# Patient Record
Sex: Male | Born: 1945 | Race: White | Hispanic: No | Marital: Married | Smoking: Never smoker
Health system: Southern US, Community
[De-identification: ages and names within clinical notes are randomized; demographics above are authoritative.]

## PROBLEM LIST (undated history)

## (undated) DIAGNOSIS — E785 Hyperlipidemia, unspecified: Secondary | ICD-10-CM

## (undated) DIAGNOSIS — Z789 Other specified health status: Secondary | ICD-10-CM

## (undated) DIAGNOSIS — M359 Systemic involvement of connective tissue, unspecified: Secondary | ICD-10-CM

## (undated) DIAGNOSIS — R3914 Feeling of incomplete bladder emptying: Secondary | ICD-10-CM

## (undated) DIAGNOSIS — N400 Enlarged prostate without lower urinary tract symptoms: Secondary | ICD-10-CM

## (undated) DIAGNOSIS — Z973 Presence of spectacles and contact lenses: Secondary | ICD-10-CM

## (undated) HISTORY — PX: OTHER SURGICAL HISTORY: SHX169

## (undated) HISTORY — PX: ANKLE FRACTURE SURGERY: SHX122

---

## 2003-09-22 ENCOUNTER — Ambulatory Visit (HOSPITAL_COMMUNITY): Admission: RE | Admit: 2003-09-22 | Discharge: 2003-09-22 | Payer: Self-pay | Admitting: Gastroenterology

## 2016-12-23 HISTORY — PX: COLONOSCOPY: SHX174

## 2017-02-28 DIAGNOSIS — Z85828 Personal history of other malignant neoplasm of skin: Secondary | ICD-10-CM | POA: Diagnosis not present

## 2017-02-28 DIAGNOSIS — Z808 Family history of malignant neoplasm of other organs or systems: Secondary | ICD-10-CM | POA: Diagnosis not present

## 2017-02-28 DIAGNOSIS — D2271 Melanocytic nevi of right lower limb, including hip: Secondary | ICD-10-CM | POA: Diagnosis not present

## 2017-02-28 DIAGNOSIS — L821 Other seborrheic keratosis: Secondary | ICD-10-CM | POA: Diagnosis not present

## 2017-02-28 DIAGNOSIS — M67479 Ganglion, unspecified ankle and foot: Secondary | ICD-10-CM | POA: Diagnosis not present

## 2017-02-28 DIAGNOSIS — L570 Actinic keratosis: Secondary | ICD-10-CM | POA: Diagnosis not present

## 2017-02-28 DIAGNOSIS — Z23 Encounter for immunization: Secondary | ICD-10-CM | POA: Diagnosis not present

## 2017-09-26 DIAGNOSIS — L821 Other seborrheic keratosis: Secondary | ICD-10-CM | POA: Diagnosis not present

## 2017-09-26 DIAGNOSIS — M6740 Ganglion, unspecified site: Secondary | ICD-10-CM | POA: Diagnosis not present

## 2017-09-26 DIAGNOSIS — D0462 Carcinoma in situ of skin of left upper limb, including shoulder: Secondary | ICD-10-CM | POA: Diagnosis not present

## 2017-09-26 DIAGNOSIS — D2271 Melanocytic nevi of right lower limb, including hip: Secondary | ICD-10-CM | POA: Diagnosis not present

## 2017-09-26 DIAGNOSIS — Z85828 Personal history of other malignant neoplasm of skin: Secondary | ICD-10-CM | POA: Diagnosis not present

## 2017-09-26 DIAGNOSIS — L570 Actinic keratosis: Secondary | ICD-10-CM | POA: Diagnosis not present

## 2017-09-26 DIAGNOSIS — Z23 Encounter for immunization: Secondary | ICD-10-CM | POA: Diagnosis not present

## 2017-09-26 DIAGNOSIS — D225 Melanocytic nevi of trunk: Secondary | ICD-10-CM | POA: Diagnosis not present

## 2017-09-26 DIAGNOSIS — D485 Neoplasm of uncertain behavior of skin: Secondary | ICD-10-CM | POA: Diagnosis not present

## 2017-09-26 DIAGNOSIS — Z808 Family history of malignant neoplasm of other organs or systems: Secondary | ICD-10-CM | POA: Diagnosis not present

## 2017-10-31 DIAGNOSIS — L301 Dyshidrosis [pompholyx]: Secondary | ICD-10-CM | POA: Diagnosis not present

## 2017-11-10 DIAGNOSIS — H5213 Myopia, bilateral: Secondary | ICD-10-CM | POA: Diagnosis not present

## 2017-11-21 DIAGNOSIS — L570 Actinic keratosis: Secondary | ICD-10-CM | POA: Diagnosis not present

## 2017-11-21 DIAGNOSIS — L988 Other specified disorders of the skin and subcutaneous tissue: Secondary | ICD-10-CM | POA: Diagnosis not present

## 2017-11-21 DIAGNOSIS — D0462 Carcinoma in situ of skin of left upper limb, including shoulder: Secondary | ICD-10-CM | POA: Diagnosis not present

## 2017-12-02 DIAGNOSIS — N3000 Acute cystitis without hematuria: Secondary | ICD-10-CM | POA: Diagnosis not present

## 2017-12-02 DIAGNOSIS — R350 Frequency of micturition: Secondary | ICD-10-CM | POA: Diagnosis not present

## 2017-12-05 DIAGNOSIS — Z1389 Encounter for screening for other disorder: Secondary | ICD-10-CM | POA: Diagnosis not present

## 2017-12-05 DIAGNOSIS — E7800 Pure hypercholesterolemia, unspecified: Secondary | ICD-10-CM | POA: Diagnosis not present

## 2017-12-05 DIAGNOSIS — Z0000 Encounter for general adult medical examination without abnormal findings: Secondary | ICD-10-CM | POA: Diagnosis not present

## 2017-12-05 DIAGNOSIS — Z79899 Other long term (current) drug therapy: Secondary | ICD-10-CM | POA: Diagnosis not present

## 2017-12-05 DIAGNOSIS — Z85828 Personal history of other malignant neoplasm of skin: Secondary | ICD-10-CM | POA: Diagnosis not present

## 2017-12-05 DIAGNOSIS — N3000 Acute cystitis without hematuria: Secondary | ICD-10-CM | POA: Diagnosis not present

## 2018-01-26 DIAGNOSIS — R69 Illness, unspecified: Secondary | ICD-10-CM | POA: Diagnosis not present

## 2018-05-01 DIAGNOSIS — D2271 Melanocytic nevi of right lower limb, including hip: Secondary | ICD-10-CM | POA: Diagnosis not present

## 2018-05-01 DIAGNOSIS — Z808 Family history of malignant neoplasm of other organs or systems: Secondary | ICD-10-CM | POA: Diagnosis not present

## 2018-05-01 DIAGNOSIS — D485 Neoplasm of uncertain behavior of skin: Secondary | ICD-10-CM | POA: Diagnosis not present

## 2018-05-01 DIAGNOSIS — D225 Melanocytic nevi of trunk: Secondary | ICD-10-CM | POA: Diagnosis not present

## 2018-05-01 DIAGNOSIS — L570 Actinic keratosis: Secondary | ICD-10-CM | POA: Diagnosis not present

## 2018-05-01 DIAGNOSIS — L821 Other seborrheic keratosis: Secondary | ICD-10-CM | POA: Diagnosis not present

## 2018-05-01 DIAGNOSIS — Z85828 Personal history of other malignant neoplasm of skin: Secondary | ICD-10-CM | POA: Diagnosis not present

## 2018-08-17 DIAGNOSIS — R69 Illness, unspecified: Secondary | ICD-10-CM | POA: Diagnosis not present

## 2018-11-16 DIAGNOSIS — D2271 Melanocytic nevi of right lower limb, including hip: Secondary | ICD-10-CM | POA: Diagnosis not present

## 2018-11-16 DIAGNOSIS — D225 Melanocytic nevi of trunk: Secondary | ICD-10-CM | POA: Diagnosis not present

## 2018-11-16 DIAGNOSIS — L821 Other seborrheic keratosis: Secondary | ICD-10-CM | POA: Diagnosis not present

## 2018-11-16 DIAGNOSIS — Z85828 Personal history of other malignant neoplasm of skin: Secondary | ICD-10-CM | POA: Diagnosis not present

## 2018-11-16 DIAGNOSIS — Z23 Encounter for immunization: Secondary | ICD-10-CM | POA: Diagnosis not present

## 2018-11-16 DIAGNOSIS — L309 Dermatitis, unspecified: Secondary | ICD-10-CM | POA: Diagnosis not present

## 2018-11-16 DIAGNOSIS — Z808 Family history of malignant neoplasm of other organs or systems: Secondary | ICD-10-CM | POA: Diagnosis not present

## 2018-12-07 DIAGNOSIS — H5213 Myopia, bilateral: Secondary | ICD-10-CM | POA: Diagnosis not present

## 2019-01-11 DIAGNOSIS — E7800 Pure hypercholesterolemia, unspecified: Secondary | ICD-10-CM | POA: Diagnosis not present

## 2019-01-11 DIAGNOSIS — Z125 Encounter for screening for malignant neoplasm of prostate: Secondary | ICD-10-CM | POA: Diagnosis not present

## 2019-01-11 DIAGNOSIS — Z79899 Other long term (current) drug therapy: Secondary | ICD-10-CM | POA: Diagnosis not present

## 2019-01-11 DIAGNOSIS — Z0000 Encounter for general adult medical examination without abnormal findings: Secondary | ICD-10-CM | POA: Diagnosis not present

## 2019-01-11 DIAGNOSIS — Z6823 Body mass index (BMI) 23.0-23.9, adult: Secondary | ICD-10-CM | POA: Diagnosis not present

## 2019-01-11 DIAGNOSIS — R739 Hyperglycemia, unspecified: Secondary | ICD-10-CM | POA: Diagnosis not present

## 2019-01-11 DIAGNOSIS — N529 Male erectile dysfunction, unspecified: Secondary | ICD-10-CM | POA: Diagnosis not present

## 2019-01-11 DIAGNOSIS — Z85828 Personal history of other malignant neoplasm of skin: Secondary | ICD-10-CM | POA: Diagnosis not present

## 2019-01-29 DIAGNOSIS — H52209 Unspecified astigmatism, unspecified eye: Secondary | ICD-10-CM | POA: Diagnosis not present

## 2019-01-29 DIAGNOSIS — H5213 Myopia, bilateral: Secondary | ICD-10-CM | POA: Diagnosis not present

## 2019-01-29 DIAGNOSIS — H524 Presbyopia: Secondary | ICD-10-CM | POA: Diagnosis not present

## 2019-02-26 DIAGNOSIS — R972 Elevated prostate specific antigen [PSA]: Secondary | ICD-10-CM | POA: Diagnosis not present

## 2019-03-01 DIAGNOSIS — R69 Illness, unspecified: Secondary | ICD-10-CM | POA: Diagnosis not present

## 2019-05-07 DIAGNOSIS — R972 Elevated prostate specific antigen [PSA]: Secondary | ICD-10-CM | POA: Diagnosis not present

## 2019-06-04 DIAGNOSIS — Z808 Family history of malignant neoplasm of other organs or systems: Secondary | ICD-10-CM | POA: Diagnosis not present

## 2019-06-04 DIAGNOSIS — L570 Actinic keratosis: Secondary | ICD-10-CM | POA: Diagnosis not present

## 2019-06-04 DIAGNOSIS — D225 Melanocytic nevi of trunk: Secondary | ICD-10-CM | POA: Diagnosis not present

## 2019-06-04 DIAGNOSIS — D2271 Melanocytic nevi of right lower limb, including hip: Secondary | ICD-10-CM | POA: Diagnosis not present

## 2019-06-04 DIAGNOSIS — L821 Other seborrheic keratosis: Secondary | ICD-10-CM | POA: Diagnosis not present

## 2019-06-04 DIAGNOSIS — Z85828 Personal history of other malignant neoplasm of skin: Secondary | ICD-10-CM | POA: Diagnosis not present

## 2019-06-30 DIAGNOSIS — R972 Elevated prostate specific antigen [PSA]: Secondary | ICD-10-CM | POA: Diagnosis not present

## 2019-06-30 HISTORY — PX: PROSTATE BIOPSY: SHX241

## 2019-09-27 DIAGNOSIS — R69 Illness, unspecified: Secondary | ICD-10-CM | POA: Diagnosis not present

## 2019-12-03 DIAGNOSIS — Z23 Encounter for immunization: Secondary | ICD-10-CM | POA: Diagnosis not present

## 2019-12-03 DIAGNOSIS — L570 Actinic keratosis: Secondary | ICD-10-CM | POA: Diagnosis not present

## 2019-12-03 DIAGNOSIS — D225 Melanocytic nevi of trunk: Secondary | ICD-10-CM | POA: Diagnosis not present

## 2019-12-03 DIAGNOSIS — D2271 Melanocytic nevi of right lower limb, including hip: Secondary | ICD-10-CM | POA: Diagnosis not present

## 2019-12-03 DIAGNOSIS — L820 Inflamed seborrheic keratosis: Secondary | ICD-10-CM | POA: Diagnosis not present

## 2019-12-03 DIAGNOSIS — Z808 Family history of malignant neoplasm of other organs or systems: Secondary | ICD-10-CM | POA: Diagnosis not present

## 2019-12-03 DIAGNOSIS — Z85828 Personal history of other malignant neoplasm of skin: Secondary | ICD-10-CM | POA: Diagnosis not present

## 2019-12-24 DIAGNOSIS — Z86006 Personal history of melanoma in-situ: Secondary | ICD-10-CM

## 2019-12-24 DIAGNOSIS — C439 Malignant melanoma of skin, unspecified: Secondary | ICD-10-CM

## 2019-12-24 HISTORY — DX: Malignant melanoma of skin, unspecified: C43.9

## 2019-12-24 HISTORY — DX: Personal history of melanoma in-situ: Z86.006

## 2020-01-24 DIAGNOSIS — R972 Elevated prostate specific antigen [PSA]: Secondary | ICD-10-CM | POA: Diagnosis not present

## 2020-01-24 DIAGNOSIS — Z0000 Encounter for general adult medical examination without abnormal findings: Secondary | ICD-10-CM | POA: Diagnosis not present

## 2020-01-24 DIAGNOSIS — N529 Male erectile dysfunction, unspecified: Secondary | ICD-10-CM | POA: Diagnosis not present

## 2020-01-24 DIAGNOSIS — Z79899 Other long term (current) drug therapy: Secondary | ICD-10-CM | POA: Diagnosis not present

## 2020-01-24 DIAGNOSIS — R739 Hyperglycemia, unspecified: Secondary | ICD-10-CM | POA: Diagnosis not present

## 2020-01-24 DIAGNOSIS — E7800 Pure hypercholesterolemia, unspecified: Secondary | ICD-10-CM | POA: Diagnosis not present

## 2020-01-24 DIAGNOSIS — Z85828 Personal history of other malignant neoplasm of skin: Secondary | ICD-10-CM | POA: Diagnosis not present

## 2020-01-31 DIAGNOSIS — H5213 Myopia, bilateral: Secondary | ICD-10-CM | POA: Diagnosis not present

## 2020-04-10 DIAGNOSIS — R69 Illness, unspecified: Secondary | ICD-10-CM | POA: Diagnosis not present

## 2020-10-23 DIAGNOSIS — R69 Illness, unspecified: Secondary | ICD-10-CM | POA: Diagnosis not present

## 2020-11-14 DIAGNOSIS — L821 Other seborrheic keratosis: Secondary | ICD-10-CM | POA: Diagnosis not present

## 2020-11-14 DIAGNOSIS — L578 Other skin changes due to chronic exposure to nonionizing radiation: Secondary | ICD-10-CM | POA: Diagnosis not present

## 2020-11-14 DIAGNOSIS — D225 Melanocytic nevi of trunk: Secondary | ICD-10-CM | POA: Diagnosis not present

## 2020-11-14 DIAGNOSIS — Z808 Family history of malignant neoplasm of other organs or systems: Secondary | ICD-10-CM | POA: Diagnosis not present

## 2020-11-14 DIAGNOSIS — L570 Actinic keratosis: Secondary | ICD-10-CM | POA: Diagnosis not present

## 2020-11-14 DIAGNOSIS — D0361 Melanoma in situ of right upper limb, including shoulder: Secondary | ICD-10-CM | POA: Diagnosis not present

## 2020-11-14 DIAGNOSIS — D2271 Melanocytic nevi of right lower limb, including hip: Secondary | ICD-10-CM | POA: Diagnosis not present

## 2020-11-14 DIAGNOSIS — Z85828 Personal history of other malignant neoplasm of skin: Secondary | ICD-10-CM | POA: Diagnosis not present

## 2020-11-14 DIAGNOSIS — D485 Neoplasm of uncertain behavior of skin: Secondary | ICD-10-CM | POA: Diagnosis not present

## 2020-12-07 DIAGNOSIS — D0361 Melanoma in situ of right upper limb, including shoulder: Secondary | ICD-10-CM | POA: Diagnosis not present

## 2020-12-07 DIAGNOSIS — L814 Other melanin hyperpigmentation: Secondary | ICD-10-CM | POA: Diagnosis not present

## 2020-12-23 DIAGNOSIS — E7800 Pure hypercholesterolemia, unspecified: Secondary | ICD-10-CM

## 2020-12-23 HISTORY — DX: Pure hypercholesterolemia, unspecified: E78.00

## 2021-02-28 DIAGNOSIS — H1032 Unspecified acute conjunctivitis, left eye: Secondary | ICD-10-CM | POA: Diagnosis not present

## 2021-03-12 DIAGNOSIS — H5213 Myopia, bilateral: Secondary | ICD-10-CM | POA: Diagnosis not present

## 2021-04-20 DIAGNOSIS — I700 Atherosclerosis of aorta: Secondary | ICD-10-CM | POA: Diagnosis not present

## 2021-04-20 DIAGNOSIS — R300 Dysuria: Secondary | ICD-10-CM | POA: Diagnosis not present

## 2021-04-20 DIAGNOSIS — R109 Unspecified abdominal pain: Secondary | ICD-10-CM | POA: Diagnosis not present

## 2021-04-20 DIAGNOSIS — N281 Cyst of kidney, acquired: Secondary | ICD-10-CM | POA: Diagnosis not present

## 2021-04-20 DIAGNOSIS — N139 Obstructive and reflux uropathy, unspecified: Secondary | ICD-10-CM | POA: Diagnosis not present

## 2021-04-20 DIAGNOSIS — K5730 Diverticulosis of large intestine without perforation or abscess without bleeding: Secondary | ICD-10-CM | POA: Diagnosis not present

## 2021-04-20 DIAGNOSIS — N200 Calculus of kidney: Secondary | ICD-10-CM | POA: Diagnosis not present

## 2021-04-25 ENCOUNTER — Other Ambulatory Visit: Payer: Self-pay | Admitting: Adult Health

## 2021-05-15 DIAGNOSIS — L249 Irritant contact dermatitis, unspecified cause: Secondary | ICD-10-CM | POA: Diagnosis not present

## 2021-05-15 DIAGNOSIS — L578 Other skin changes due to chronic exposure to nonionizing radiation: Secondary | ICD-10-CM | POA: Diagnosis not present

## 2021-05-15 DIAGNOSIS — D2271 Melanocytic nevi of right lower limb, including hip: Secondary | ICD-10-CM | POA: Diagnosis not present

## 2021-05-15 DIAGNOSIS — Z808 Family history of malignant neoplasm of other organs or systems: Secondary | ICD-10-CM | POA: Diagnosis not present

## 2021-05-15 DIAGNOSIS — L821 Other seborrheic keratosis: Secondary | ICD-10-CM | POA: Diagnosis not present

## 2021-05-15 DIAGNOSIS — Z86006 Personal history of melanoma in-situ: Secondary | ICD-10-CM | POA: Diagnosis not present

## 2021-05-15 DIAGNOSIS — D225 Melanocytic nevi of trunk: Secondary | ICD-10-CM | POA: Diagnosis not present

## 2021-05-15 DIAGNOSIS — L570 Actinic keratosis: Secondary | ICD-10-CM | POA: Diagnosis not present

## 2021-05-15 DIAGNOSIS — Z85828 Personal history of other malignant neoplasm of skin: Secondary | ICD-10-CM | POA: Diagnosis not present

## 2021-06-29 DIAGNOSIS — N3000 Acute cystitis without hematuria: Secondary | ICD-10-CM | POA: Diagnosis not present

## 2021-06-29 DIAGNOSIS — R8279 Other abnormal findings on microbiological examination of urine: Secondary | ICD-10-CM | POA: Diagnosis not present

## 2021-06-29 DIAGNOSIS — R300 Dysuria: Secondary | ICD-10-CM | POA: Diagnosis not present

## 2021-09-24 DIAGNOSIS — E7800 Pure hypercholesterolemia, unspecified: Secondary | ICD-10-CM | POA: Diagnosis not present

## 2021-09-24 DIAGNOSIS — R972 Elevated prostate specific antigen [PSA]: Secondary | ICD-10-CM | POA: Diagnosis not present

## 2021-09-24 DIAGNOSIS — Z79899 Other long term (current) drug therapy: Secondary | ICD-10-CM | POA: Diagnosis not present

## 2021-09-24 DIAGNOSIS — R739 Hyperglycemia, unspecified: Secondary | ICD-10-CM | POA: Diagnosis not present

## 2021-09-28 DIAGNOSIS — I700 Atherosclerosis of aorta: Secondary | ICD-10-CM | POA: Diagnosis not present

## 2021-09-28 DIAGNOSIS — R972 Elevated prostate specific antigen [PSA]: Secondary | ICD-10-CM | POA: Diagnosis not present

## 2021-09-28 DIAGNOSIS — N529 Male erectile dysfunction, unspecified: Secondary | ICD-10-CM | POA: Diagnosis not present

## 2021-09-28 DIAGNOSIS — E7800 Pure hypercholesterolemia, unspecified: Secondary | ICD-10-CM | POA: Diagnosis not present

## 2021-09-28 DIAGNOSIS — Z1389 Encounter for screening for other disorder: Secondary | ICD-10-CM | POA: Diagnosis not present

## 2021-09-28 DIAGNOSIS — Z0000 Encounter for general adult medical examination without abnormal findings: Secondary | ICD-10-CM | POA: Diagnosis not present

## 2021-09-28 DIAGNOSIS — R739 Hyperglycemia, unspecified: Secondary | ICD-10-CM | POA: Diagnosis not present

## 2021-09-28 DIAGNOSIS — K068 Other specified disorders of gingiva and edentulous alveolar ridge: Secondary | ICD-10-CM | POA: Diagnosis not present

## 2021-11-13 DIAGNOSIS — D0439 Carcinoma in situ of skin of other parts of face: Secondary | ICD-10-CM | POA: Diagnosis not present

## 2021-11-13 DIAGNOSIS — D2271 Melanocytic nevi of right lower limb, including hip: Secondary | ICD-10-CM | POA: Diagnosis not present

## 2021-11-13 DIAGNOSIS — L578 Other skin changes due to chronic exposure to nonionizing radiation: Secondary | ICD-10-CM | POA: Diagnosis not present

## 2021-11-13 DIAGNOSIS — Z808 Family history of malignant neoplasm of other organs or systems: Secondary | ICD-10-CM | POA: Diagnosis not present

## 2021-11-13 DIAGNOSIS — D485 Neoplasm of uncertain behavior of skin: Secondary | ICD-10-CM | POA: Diagnosis not present

## 2021-11-13 DIAGNOSIS — L821 Other seborrheic keratosis: Secondary | ICD-10-CM | POA: Diagnosis not present

## 2021-11-13 DIAGNOSIS — D225 Melanocytic nevi of trunk: Secondary | ICD-10-CM | POA: Diagnosis not present

## 2021-11-13 DIAGNOSIS — Z85828 Personal history of other malignant neoplasm of skin: Secondary | ICD-10-CM | POA: Diagnosis not present

## 2021-11-13 DIAGNOSIS — Z86006 Personal history of melanoma in-situ: Secondary | ICD-10-CM | POA: Diagnosis not present

## 2021-11-13 DIAGNOSIS — L570 Actinic keratosis: Secondary | ICD-10-CM | POA: Diagnosis not present

## 2022-01-16 DIAGNOSIS — N3000 Acute cystitis without hematuria: Secondary | ICD-10-CM | POA: Diagnosis not present

## 2022-01-16 DIAGNOSIS — N200 Calculus of kidney: Secondary | ICD-10-CM | POA: Diagnosis not present

## 2022-01-16 DIAGNOSIS — R972 Elevated prostate specific antigen [PSA]: Secondary | ICD-10-CM | POA: Diagnosis not present

## 2022-01-16 DIAGNOSIS — Z87442 Personal history of urinary calculi: Secondary | ICD-10-CM

## 2022-01-16 HISTORY — DX: Personal history of urinary calculi: Z87.442

## 2022-01-16 HISTORY — DX: Elevated prostate specific antigen (PSA): R97.20

## 2022-01-23 DIAGNOSIS — C44300 Unspecified malignant neoplasm of skin of unspecified part of face: Secondary | ICD-10-CM

## 2022-01-23 DIAGNOSIS — Z23 Encounter for immunization: Secondary | ICD-10-CM | POA: Diagnosis not present

## 2022-01-23 DIAGNOSIS — D0430 Carcinoma in situ of skin of unspecified part of face: Secondary | ICD-10-CM | POA: Diagnosis not present

## 2022-01-23 DIAGNOSIS — Z85828 Personal history of other malignant neoplasm of skin: Secondary | ICD-10-CM

## 2022-01-23 HISTORY — DX: Personal history of other malignant neoplasm of skin: Z85.828

## 2022-01-23 HISTORY — DX: Unspecified malignant neoplasm of skin of unspecified part of face: C44.300

## 2022-01-24 ENCOUNTER — Other Ambulatory Visit: Payer: Self-pay | Admitting: Urology

## 2022-01-24 DIAGNOSIS — R972 Elevated prostate specific antigen [PSA]: Secondary | ICD-10-CM

## 2022-02-17 ENCOUNTER — Ambulatory Visit
Admission: RE | Admit: 2022-02-17 | Discharge: 2022-02-17 | Disposition: A | Discharge status: Home / Self Care | Payer: Self-pay | Source: Ambulatory Visit | Attending: Urology | Admitting: Urology

## 2022-02-17 ENCOUNTER — Other Ambulatory Visit: Payer: Self-pay

## 2022-02-17 DIAGNOSIS — R590 Localized enlarged lymph nodes: Secondary | ICD-10-CM | POA: Diagnosis not present

## 2022-02-17 DIAGNOSIS — N400 Enlarged prostate without lower urinary tract symptoms: Secondary | ICD-10-CM | POA: Diagnosis not present

## 2022-02-17 DIAGNOSIS — K5730 Diverticulosis of large intestine without perforation or abscess without bleeding: Secondary | ICD-10-CM | POA: Diagnosis not present

## 2022-02-17 DIAGNOSIS — R972 Elevated prostate specific antigen [PSA]: Secondary | ICD-10-CM

## 2022-02-17 IMAGING — MR MR PROSTATE WO/W CM
12 series · 48 of 48 positions shown · IV contrast (15 ml multihance)
Comparison: None.

CLINICAL DATA: Elevated PSA of 7.3.  Prior negative biopsy [DATE].

EXAM:
MR PROSTATE WITHOUT AND WITH CONTRAST
TECHNIQUE: Multiplanar multisequence MRI images were obtained of the pelvis
centered about the prostate. Pre and post contrast images were
obtained.
CONTRAST:  15mL MULTIHANCE GADOBENATE DIMEGLUMINE 529 MG/ML IV SOLN

[Series 3: T2 · coronal · 3.0mm · 0.56mm/px · 1 of 30 slices shown (1 of 3)]
[im 1/30]
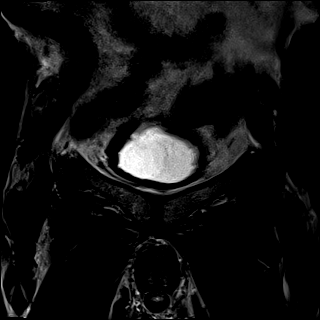

[Series 4: T1 · axial · 5.0mm · 1.25mm/px · 1 of 80 slices shown]
[im 1/80]
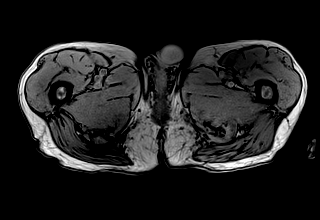

[Series 5: DWI · axial · 3.0mm · 1.75mm/px · 1 of 84 slices shown (1 of 3)]
[im 1/84]
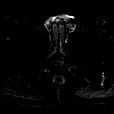

[Series 6: DWI · axial · 3.0mm · 1.75mm/px · 1 of 28 slices shown (2 of 3)]
[im 1/28]
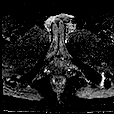

[Series 7: DWI · axial · 3.0mm · 1.75mm/px · 1 of 28 slices shown (3 of 3)]
[im 1/28]
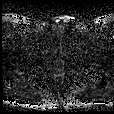

[Series 8: T2 · axial · 3.0mm · 0.56mm/px · 1 of 30 slices shown (2 of 3)]
[im 1/30]
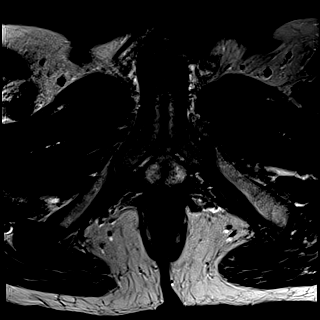

[Series 9: T2 · axial · 1.0mm · 1.04mm/px · z∈[-58,+21]mm · 2 of 80 slices shown (3 of 3)]
[im 1/80]
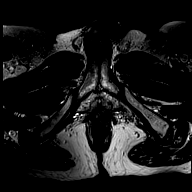
[im 80/80]
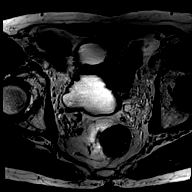

[Series 10: pre t1_twist_tra_dyn · axial · non-contrast · 3.5mm · 0.83mm/px · 1 of 24 slices shown]
[im 1/24]
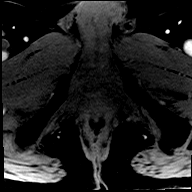

[Series 11: post t1_twist_tra_dyn-copy center · axial · non-contrast · 3.5mm · 0.83mm/px · z∈[-53,+27]mm · 18 of 720 slices shown]
[im 1/720]
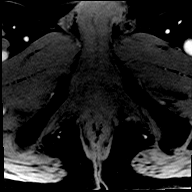
[im 43/720]
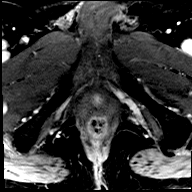
[im 85/720]
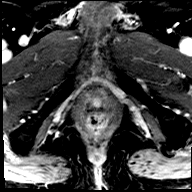
[im 127/720]
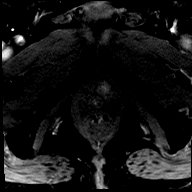
[im 170/720]
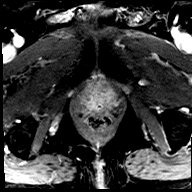
[im 212/720]
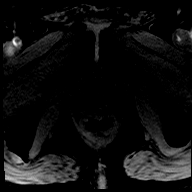
[im 254/720]
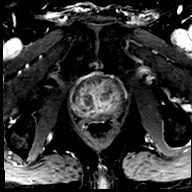
[im 297/720]
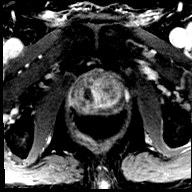
[im 339/720]
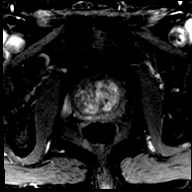
[im 381/720]
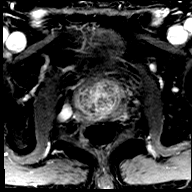
[im 423/720]
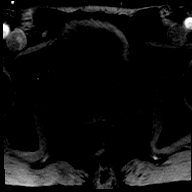
[im 466/720]
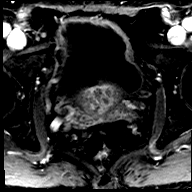
[im 508/720]
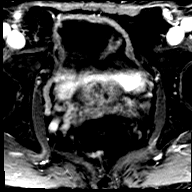
[im 550/720]
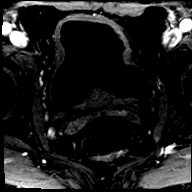
[im 593/720]
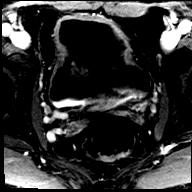
[im 635/720]
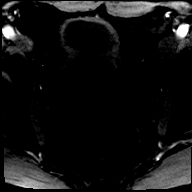
[im 677/720]
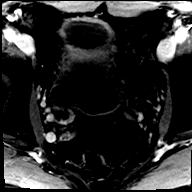
[im 720/720]
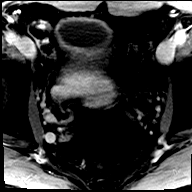

[Series 12: post t1_twist_tra_dyn-copy cent_sub · axial · 3.5mm · 0.83mm/px · z∈[-53,+27]mm · 17 of 689 slices shown]
[im 1/689]
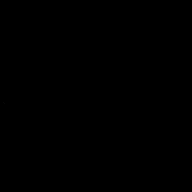
[im 44/689]
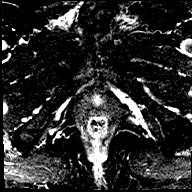
[im 87/689]
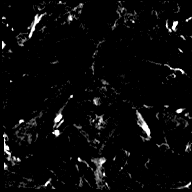
[im 130/689]
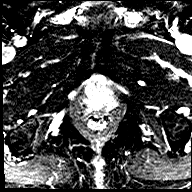
[im 173/689]
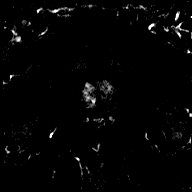
[im 216/689]
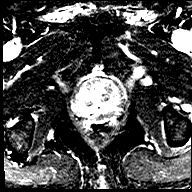
[im 259/689]
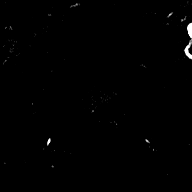
[im 302/689]
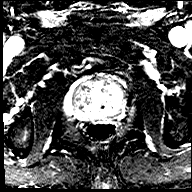
[im 345/689]
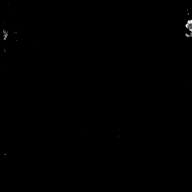
[im 388/689]
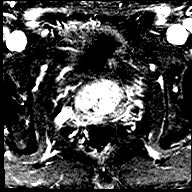
[im 431/689]
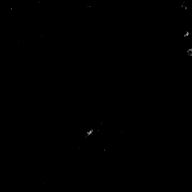
[im 474/689]
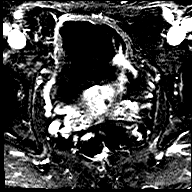
[im 517/689]
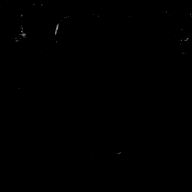
[im 560/689]
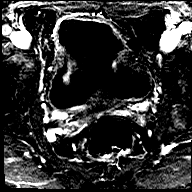
[im 603/689]
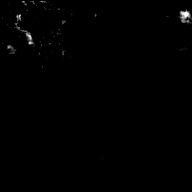
[im 646/689]
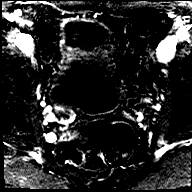
[im 689/689]
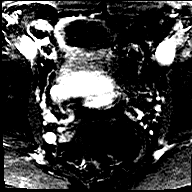

[Series 13: t1_vibe_dixon_tra_f · axial · 2.5mm · 0.91mm/px · z∈[-83,+115]mm · 2 of 80 slices shown]
[im 1/80]
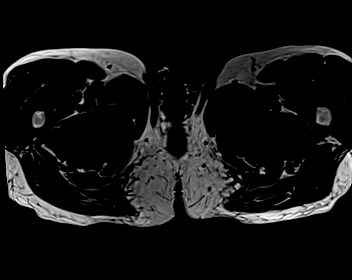
[im 80/80]
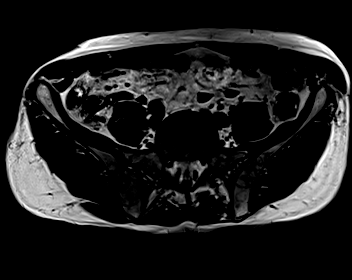

[Series 14: t1_vibe_dixon_tra_w · axial · 2.5mm · 0.91mm/px · z∈[-83,+115]mm · 2 of 80 slices shown]
[im 1/80]
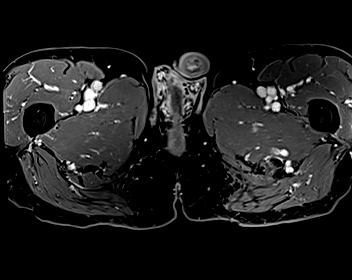
[im 80/80]
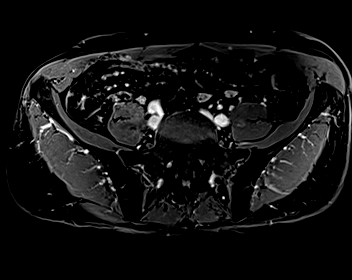

[48 of 48 positions shown; findings below may reference images not displayed]

FINDINGS: Prostate: Demonstrates mild central gland heterogeneity and
enlargement, most consistent with benign prostatic hyperplasia.

An anterior left apical area of vague ill-defined T2 hypointensity
including at on the order of 11 mm on [DATE] corresponds to
hypointensity on ADC map [DATE] and hyperintensity on long B value
[DATE]. Favored to arise from the central gland and is considered
PI-RADS(v2.1) - 3.

No areas of peripheral zone masslike T2 hypointensity, restricted
diffusion, or early post-contrast enhancement.

Volume: 4.4 x 3.4 x 4.9 cm (volume = 38 cm^3)

Transcapsular spread:  Absent

Seminal vesicle involvement: Absent

Neurovascular bundle involvement: Absent

Pelvic adenopathy: Absent

Bone metastasis: Absent

Other findings: No significant free fluid. Extensive colonic
diverticulosis. Normal urinary bladder.
IMPRESSION: 1. Vague left apical multiparametric signal abnormality is favored
to arise from the central gland and is considered PI-RADS(v2.1)-3.
2.  No evidence of locally advanced or pelvic metastatic disease.

(I have post-processed this exam in the DynaCAD application for
potential fusion-guided biopsy.)

## 2022-02-17 MED ORDER — GADOBENATE DIMEGLUMINE 529 MG/ML IV SOLN
15.0000 mL | Freq: Once | INTRAVENOUS | Status: AC | PRN
  Administered 2022-02-17: 15 mL via INTRAVENOUS

## 2022-02-19 ENCOUNTER — Other Ambulatory Visit: Payer: Self-pay

## 2022-02-20 DIAGNOSIS — C61 Malignant neoplasm of prostate: Secondary | ICD-10-CM

## 2022-02-20 HISTORY — DX: Malignant neoplasm of prostate: C61

## 2022-03-08 DIAGNOSIS — C61 Malignant neoplasm of prostate: Secondary | ICD-10-CM | POA: Diagnosis not present

## 2022-03-08 DIAGNOSIS — R972 Elevated prostate specific antigen [PSA]: Secondary | ICD-10-CM | POA: Diagnosis not present

## 2022-03-08 HISTORY — PX: PROSTATE BIOPSY: SHX241

## 2022-03-13 NOTE — Progress Notes (Signed)
GU Location of Tumor / Histology: Prostate Ca ? ?If Prostate Cancer, Gleason Score is (4 + 3) and PSA is (7.33 as of 12/2021) ? ?Biopsies: ?Dr. Diona Fanti ? ? ? ? ? ? ? ?Past/Anticipated interventions by urology, if any:  ? ?Past/Anticipated interventions by medical oncology, if any:  ? ?Weight changes, if any: No ? ?IPSS:  7 ?SHIM: 19 ? ?Bowel/Bladder complaints, if any:  No ? ?Nausea/Vomiting, if any:  No ? ?Pain issues, if any:  0/10 ? ?SAFETY ISSUES: ?Prior radiation?  No ?Pacemaker/ICD? No ?Possible current pregnancy? Male ?Is the patient on methotrexate?  No ? ?Current Complaints / other details:  Need more information on treatment options. ?

## 2022-03-19 ENCOUNTER — Encounter: Payer: Self-pay | Admitting: Radiation Oncology

## 2022-03-19 ENCOUNTER — Other Ambulatory Visit: Payer: Self-pay

## 2022-03-19 ENCOUNTER — Ambulatory Visit
Admission: RE | Admit: 2022-03-19 | Discharge: 2022-03-19 | Disposition: A | Discharge status: Home / Self Care | Payer: Medicare HMO | Source: Ambulatory Visit | Attending: Radiation Oncology | Admitting: Radiation Oncology

## 2022-03-19 VITALS — BP 147/78 | HR 63 | Temp 97.3°F | Resp 18 | Ht 69.0 in | Wt 155.4 lb

## 2022-03-19 DIAGNOSIS — D225 Melanocytic nevi of trunk: Secondary | ICD-10-CM | POA: Insufficient documentation

## 2022-03-19 DIAGNOSIS — Z85828 Personal history of other malignant neoplasm of skin: Secondary | ICD-10-CM | POA: Insufficient documentation

## 2022-03-19 DIAGNOSIS — Z79899 Other long term (current) drug therapy: Secondary | ICD-10-CM | POA: Insufficient documentation

## 2022-03-19 DIAGNOSIS — Z923 Personal history of irradiation: Secondary | ICD-10-CM | POA: Insufficient documentation

## 2022-03-19 DIAGNOSIS — C61 Malignant neoplasm of prostate: Secondary | ICD-10-CM | POA: Insufficient documentation

## 2022-03-19 DIAGNOSIS — L821 Other seborrheic keratosis: Secondary | ICD-10-CM | POA: Insufficient documentation

## 2022-03-19 DIAGNOSIS — Z808 Family history of malignant neoplasm of other organs or systems: Secondary | ICD-10-CM | POA: Insufficient documentation

## 2022-03-19 DIAGNOSIS — Z87440 Personal history of urinary (tract) infections: Secondary | ICD-10-CM | POA: Diagnosis not present

## 2022-03-19 DIAGNOSIS — D2370 Other benign neoplasm of skin of unspecified lower limb, including hip: Secondary | ICD-10-CM | POA: Insufficient documentation

## 2022-03-19 NOTE — Progress Notes (Signed)
Introduced myself to the patient as the prostate nurse navigator.  No barriers to care identified at this time.  He is here to discuss his radiation treatment options.  I gave him my business card and asked him to call me with questions or concerns.  Verbalized understanding.  ?

## 2022-03-19 NOTE — Progress Notes (Signed)
?Radiation Oncology         [phone] ?________________________________ ? ?Initial Outpatient Consultation ? ?Name: Nicholas Santos MRN: [id number]  ?Date: 03/19/2022  DOB: 1946-09-24 ? ?CC:Pcp, No  Franchot Gallo, MD  ? ?REFERRING PHYSICIAN: Franchot Gallo, MD ? ?DIAGNOSIS: 76 y.o. gentleman with Stage T1c adenocarcinoma of the prostate with Gleason score of 3+4, and PSA of 7.33. ? ?  ICD-10-CM   ?1. Malignant neoplasm of prostate ([place])  C61   ?  ? ? ?HISTORY OF PRESENT ILLNESS: Nicholas Santos is a 76 y.o. male with a diagnosis of prostate cancer. He has been followed for a fluctuating PSA since at least 2007. He was referred back to Dr. Diona Fanti in 04/2019 for a rising PSA of 4.96. He underwent prostate biopsy on 06/30/19 that showed only one area of atypia but no malignancy. He continued to monitor the PSA with his PCP but was seen back in the urology clinic in 03/2021 and 06/2021 for UTI which did resolve with antibiotics. ? ?He was noted to have an elevated PSA of 7.02 in 09/2021 by his primary care physician, Dr. Sabra Heck.  Accordingly, he was referred back to Dr. Diona Fanti on 01/16/22,  digital rectal examination was performed at that time revealing no nodules. Repeat PSA that day showed persistent elevation at 7.33. Therefore, he had a prostate MRI on 02/17/22 for further evaluation and this showed vague left apical multiparametric signal abnormality, favored to arise from the central gland (PI-RADS 3) but no evidence of locally advanced or pelvic metastatic disease. The patient proceeded to MRI fusion biopsy of the prostate on 03/08/22.  The prostate volume measured 53 cc.  Out of 16 core biopsies, 4 were positive.  The maximum Gleason score was 3+4, and this was seen in all four ROI samples. Of note, all 12 standard samples were benign, with only one showing atypia. ? ?The patient reviewed the biopsy results with his urologist and he has kindly been referred today for discussion of potential radiation  treatment options. ? ? ?PREVIOUS RADIATION THERAPY: No ? ?PAST MEDICAL HISTORY: History reviewed. No pertinent past medical history.   ? ?PAST SURGICAL HISTORY:History reviewed. No pertinent surgical history. ? ?FAMILY HISTORY: History reviewed. No pertinent family history. ? 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 ?Social History  ? ?Socioeconomic History  ? Marital status: Married  ?  Spouse name: Not on file  ? Number of children: Not on file  ? Years of education: Not on file  ? Highest education level: Not on file  ?Occupational History  ? Not on file  ?Tobacco Use  ? Smoking status: Not on file  ? Smokeless tobacco: Not on file  ?Substance and Sexual Activity  ? Alcohol use: Not on file  ? Drug use: Not on file  ? Sexual activity: Not on file  ?Other Topics Concern  ? Not on file  ?Social History Narrative  ? Not on file  ? ?Social Determinants of Health  ? ?Financial Resource Strain: Not on file  ?Food Insecurity: Not on file  ?Transportation Needs: Not on file  ?Physical Activity: Not on file  ?Stress: Not on file  ?Social Connections: Not on file  ?Intimate Partner Violence: Not on file  ? ? ?ALLERGIES: Meperidine hcl ? ?MEDICATIONS:  ?Current Outpatient Medications  ?Medication Sig Dispense Refill  ? clobetasol ointment (TEMOVATE) 0.05 % SMARTSIG:sparingly Topical  Twice Daily    ? sildenafil (REVATIO) 20 MG tablet SMARTSIG:1-5 Tablet(s) By Mouth Daily PRN    ? simvastatin (ZOCOR) 20 MG tablet     ? tamsulosin (FLOMAX) 0.4 MG CAPS capsule Take 0.4 mg by mouth daily.    ? ?No current facility-administered medications for this encounter.  ? ? ?REVIEW OF SYSTEMS:  On review of systems, the patient reports that he is doing well overall. He denies any chest pain, shortness of breath, cough, fevers, chills, night sweats, unintended weight changes. He denies any bowel disturbances, and denies  abdominal pain, nausea or vomiting. He denies any new musculoskeletal or joint aches or pains. He has had issues with his gums bleeding easily and is scheduled for a biopsy in May 2023 to r/o autoimmune disorder. His PCP recently did a lab panel to evaluate for clotting factor abnormalities and this was negative. His IPSS was 7, indicating mild urinary symptoms. His SHIM was 19, indicating he has mild erectile dysfunction. A complete review of systems is obtained and is otherwise negative. ? ?  ?PHYSICAL EXAM:  ?Wt Readings from Last 3 Encounters:  ?03/19/22 155 lb 6 oz (70.5 kg)  ? ?Temp Readings from Last 3 Encounters:  ?03/19/22 (!) 97.3 ?F (36.3 ?C) (Temporal)  ? ?BP Readings from Last 3 Encounters:  ?03/19/22 (!) 147/78  ? ?Pulse Readings from Last 3 Encounters:  ?03/19/22 63  ? ?Pain Assessment ?Pain Score: 0-No pain/10 ? ?In general this is a well appearing Caucasian male in no acute distress. He's alert and oriented x4 and appropriate throughout the examination. Cardiopulmonary assessment is negative for acute distress, and he exhibits normal effort.   ? ? ?KPS = 100 ? ?100 - Normal; no complaints; no evidence of disease. ?90   - Able to carry on normal activity; minor signs or symptoms of disease. ?80   - Normal activity with effort; some signs or symptoms of disease. ?17   - Cares for self; unable to carry on normal activity or to do active work. ?60   - Requires occasional assistance, but is able to care for most of his personal needs. ?50   - Requires considerable assistance and frequent medical care. ?94   - Disabled; requires special care and assistance. ?30   - Severely disabled; hospital admission is indicated although death not imminent. ?20   - Very sick; hospital admission necessary; active supportive treatment necessary. ?10   - Moribund; fatal processes progressing rapidly. ?0     - Dead ? ?Karnofsky DA, Abelmann WH, Craver LS and Burchenal [facility] [phone]) The use of the nitrogen mustards in the  palliative treatment of carcinoma: with particular reference to bronchogenic carcinoma Cancer 1 634-56 ? ?LABORATORY DATA:  ?No results found for: WBC, HGB, HCT, MCV, PLT ?No results found for: NA, K, CL, CO2 ?No results found for: ALT, AST, GGT, ALKPHOS, BILITOT ?  ?RADIOGRAPHY: No results found. ?   ?IMPRESSION/PLAN: ?1. 76 y.o. gentleman with Stage T1c adenocarcinoma of the prostate with Gleason Score of 3+4, and PSA of 7.33. ?We discussed the patient's workup and outlined the nature of prostate cancer in this setting. The patient's T stage, Gleason's score, and PSA put him into the favorable intermediate risk group. Accordingly, he is eligible for a variety of potential treatment options including brachytherapy, 5.5 weeks of external radiation, or prostatectomy. We discussed the available radiation techniques, and focused on the details and logistics of delivery. We discussed and outlined the risks, benefits, short and long-term  effects associated with radiotherapy and compared and contrasted these with prostatectomy. We discussed the role of SpaceOAR gel in reducing the rectal toxicity associated with radiotherapy. He appears to have a good understanding of his disease and our treatment recommendations which are of curative intent.  He was encouraged to ask questions that were answered to his stated satisfaction. ? ?At the conclusion of our conversation, the patient is interested in moving forward with brachytherapy and use of SpaceOAR gel to reduce rectal toxicity from radiotherapy.  We will share our discussion with Dr. Diona Fanti and move forward with scheduling his CT [facility] planning appointment in the near future. He will be having an outpatient oral surgical procedure for gum biopsy in May 2023 so we will work around this. The patient met briefly with Romie Jumper in our office who will be working closely with him to coordinate OR scheduling and pre and post procedure appointments.  We will contact the  pharmaceutical rep to ensure that [facility] [place] is available at the time of procedure.  We enjoyed meeting him today and look forward to continuing to participate in his care. ? ?We personally spent 60 minutes in this enc

## 2022-03-22 ENCOUNTER — Telehealth: Payer: Self-pay | Admitting: *Deleted

## 2022-03-22 NOTE — Progress Notes (Signed)
Chart reviewed if cxr/ ekg needed per anesthesia prior to surgery scheduled 05-31-2022.  Pt does not need either one.  Enid Derry, Dr Tammi Klippel office is aware. ?

## 2022-03-22 NOTE — Telephone Encounter (Signed)
CALLED PATIENT TO INFORM OF PRE-SEED APPTS. FOR 04-25-22 AND HIS IMPLANT FOR 05-31-22, SPOKE WITH PATENT AND HE IS AWARE OF THESE APPTS. ?

## 2022-03-24 DIAGNOSIS — Z191 Hormone sensitive malignancy status: Secondary | ICD-10-CM | POA: Diagnosis not present

## 2022-03-24 DIAGNOSIS — C61 Malignant neoplasm of prostate: Secondary | ICD-10-CM | POA: Diagnosis not present

## 2022-03-27 DIAGNOSIS — L121 Cicatricial pemphigoid: Secondary | ICD-10-CM | POA: Diagnosis not present

## 2022-03-28 ENCOUNTER — Telehealth: Payer: Self-pay | Admitting: *Deleted

## 2022-03-28 NOTE — Telephone Encounter (Signed)
RETURNED PATIENT'S PHONE CALL, SPOKE WITH PATIENT. ?

## 2022-04-01 ENCOUNTER — Telehealth: Payer: Self-pay | Admitting: *Deleted

## 2022-04-01 NOTE — Telephone Encounter (Signed)
Called patient to give new implant date of 06-28-22 @ 11:30 am, spoke with patient and he is aware of this new date ?

## 2022-04-05 ENCOUNTER — Other Ambulatory Visit: Payer: Self-pay | Admitting: Urology

## 2022-04-24 ENCOUNTER — Telehealth: Payer: Self-pay | Admitting: *Deleted

## 2022-04-24 NOTE — Telephone Encounter (Signed)
CALLED PATIENT TO REMIND OF PRE-SEED APPTS, FOR 04-25-22- ARRIVAL TIME - 8:15 AM @ [place], SPOKE WITH PATIENT AND HE IS AWARE OF THESE APPTS. ?

## 2022-04-25 ENCOUNTER — Encounter: Payer: Self-pay | Admitting: Urology

## 2022-04-25 ENCOUNTER — Ambulatory Visit
Admission: RE | Admit: 2022-04-25 | Discharge: 2022-04-25 | Disposition: A | Discharge status: Home / Self Care | Payer: Medicare HMO | Source: Ambulatory Visit | Attending: Radiation Oncology | Admitting: Radiation Oncology

## 2022-04-25 ENCOUNTER — Other Ambulatory Visit: Payer: Self-pay

## 2022-04-25 ENCOUNTER — Ambulatory Visit
Admission: RE | Admit: 2022-04-25 | Discharge: 2022-04-25 | Disposition: A | Discharge status: Home / Self Care | Payer: Medicare HMO | Source: Ambulatory Visit | Attending: Urology | Admitting: Urology

## 2022-04-25 VITALS — Resp 20 | Ht 68.5 in | Wt 155.0 lb

## 2022-04-25 DIAGNOSIS — C61 Malignant neoplasm of prostate: Secondary | ICD-10-CM

## 2022-04-25 DIAGNOSIS — Z191 Hormone sensitive malignancy status: Secondary | ICD-10-CM | POA: Diagnosis not present

## 2022-04-25 NOTE — Progress Notes (Signed)
?  Radiation Oncology         [phone] ?________________________________ ? ?Name: Nicholas Santos MRN: [id number]  ?Date: 04/25/2022  DOB: May 03, 1946 ? ?SIMULATION AND TREATMENT PLANNING NOTE ?PUBIC ARCH STUDY ? ?CC:Pcp, No  Franchot Gallo, MD ? ?DIAGNOSIS: 76 y.o. gentleman with Stage T1c adenocarcinoma of the prostate with Gleason score of 3+4, and PSA of 7.33. ? ?Oncology History  ?Malignant neoplasm of prostate ([place])  ?03/08/2022 Cancer Staging  ? Staging form: Prostate, AJCC 8th Edition ?- Clinical stage from 03/08/2022: Stage IIB (cT1c, cN0, cM0, PSA: 7.3, Grade Group: 2) - Signed by Freeman Caldron, PA-C on 03/19/2022 ?Histopathologic type: Adenocarcinoma, NOS ?Stage prefix: Initial diagnosis ?Prostate specific antigen (PSA) range: Less than 10 ?Gleason primary pattern: 3 ?Gleason secondary pattern: 4 ?Gleason score: 7 ?Histologic grading system: 5 grade system ?Number of biopsy cores examined: 16 ?Number of biopsy cores positive: 4 ?Location of positive needle core biopsies: One side ? ?  ?03/19/2022 Initial Diagnosis  ? Malignant neoplasm of prostate ([place]) ? ?  ? ? ?  ICD-10-CM   ?1. Malignant neoplasm of prostate ([place])  C61   ?  ? ? ?COMPLEX SIMULATION:  The patient presented today for evaluation for possible prostate seed implant. He was brought to the radiation planning suite and placed supine on the CT couch. A 3-dimensional image study set was obtained in upload to the planning computer. There, on each axial slice, I contoured the prostate gland. Then, using three-dimensional radiation planning tools I reconstructed the prostate in view of the structures from the transperineal needle pathway to assess for possible pubic arch interference. In doing so, I did not appreciate any pubic arch interference. Also, the patient's prostate volume was estimated based on the drawn structure. The volume was 53 cc.  Given the pubic arch appearance and prostate volume, patient remains a good candidate to proceed with  prostate seed implant. Today, he freely provided informed written consent to proceed. ? ? ? ?PLAN: The patient will undergo prostate seed implant. ? ? ?________________________________ ? ?Sheral Apley Tammi Klippel, M.D. ? ? ? ? ?

## 2022-04-25 NOTE — Progress Notes (Signed)
Pre-Seed appointment. I verified patient identity and began nursing interview. Patient is doing well. No prostate related issues or discomfort reported at this time. ? ?Meaningful use complete. ?Flomax 0.'4mg'$  as directed. ?Urology appointment-July, 2023- per patient ? ?Resp 20   Ht 5' 8.5" (1.74 m)   Wt 155 lb (70.3 kg)   BMI 23.22 kg/m?  ? ? ?

## 2022-04-26 ENCOUNTER — Telehealth: Payer: Self-pay | Admitting: *Deleted

## 2022-04-26 NOTE — Telephone Encounter (Signed)
Called patient to see if he has any questions, lvm for a return call ?

## 2022-05-08 DIAGNOSIS — H5213 Myopia, bilateral: Secondary | ICD-10-CM | POA: Diagnosis not present

## 2022-06-03 DIAGNOSIS — L570 Actinic keratosis: Secondary | ICD-10-CM | POA: Diagnosis not present

## 2022-06-03 DIAGNOSIS — L121 Cicatricial pemphigoid: Secondary | ICD-10-CM | POA: Diagnosis not present

## 2022-06-03 DIAGNOSIS — Z85828 Personal history of other malignant neoplasm of skin: Secondary | ICD-10-CM | POA: Diagnosis not present

## 2022-06-03 DIAGNOSIS — L578 Other skin changes due to chronic exposure to nonionizing radiation: Secondary | ICD-10-CM | POA: Diagnosis not present

## 2022-06-03 DIAGNOSIS — D225 Melanocytic nevi of trunk: Secondary | ICD-10-CM | POA: Diagnosis not present

## 2022-06-03 DIAGNOSIS — Z86006 Personal history of melanoma in-situ: Secondary | ICD-10-CM | POA: Diagnosis not present

## 2022-06-14 DIAGNOSIS — C61 Malignant neoplasm of prostate: Secondary | ICD-10-CM | POA: Diagnosis not present

## 2022-06-14 DIAGNOSIS — R8271 Bacteriuria: Secondary | ICD-10-CM | POA: Diagnosis not present

## 2022-06-18 ENCOUNTER — Encounter (HOSPITAL_BASED_OUTPATIENT_CLINIC_OR_DEPARTMENT_OTHER): Payer: Self-pay | Admitting: Urology

## 2022-06-20 ENCOUNTER — Other Ambulatory Visit: Payer: Self-pay

## 2022-06-20 ENCOUNTER — Encounter (HOSPITAL_BASED_OUTPATIENT_CLINIC_OR_DEPARTMENT_OTHER): Payer: Self-pay | Admitting: Urology

## 2022-06-20 NOTE — Progress Notes (Signed)
Spoke w/ via phone for pre-op interview---Nicholas Santos needs dos---- none              Santos results------none COVID test -----patient states asymptomatic no test needed Arrive at -------0930 on Friday, 06/28/22 NPO after MN NO Solid Food.  Clear liquids from MN until---0830 Med rec completed Medications to take morning of surgery -----Flomax Diabetic medication -----n/a Patient instructed no nail polish to be worn day of surgery Patient instructed to bring photo id and insurance card day of surgery Patient aware to have Driver (ride ) / caregiver    for 24 hours after surgery - wife, Nicholas Santos Patient Special Instructions -----Fleet's enema morning of surgery. Pre-Op special Istructions -----none Patient verbalized understanding of instructions that were given at this phone interview. Patient denies shortness of breath, chest pain, fever, cough at this phone interview.

## 2022-06-26 DIAGNOSIS — L129 Pemphigoid, unspecified: Secondary | ICD-10-CM | POA: Diagnosis not present

## 2022-06-27 ENCOUNTER — Telehealth: Payer: Self-pay | Admitting: *Deleted

## 2022-06-27 NOTE — Telephone Encounter (Signed)
CALLED PATIENT TO REMIND OF PROCEDURE FOR 06-28-22, SPOKE WITH PATIENT AND HE IS AWARE OF THIS PROCEDURE

## 2022-06-28 ENCOUNTER — Encounter (HOSPITAL_BASED_OUTPATIENT_CLINIC_OR_DEPARTMENT_OTHER): Payer: Self-pay | Admitting: Urology

## 2022-06-28 ENCOUNTER — Encounter (HOSPITAL_BASED_OUTPATIENT_CLINIC_OR_DEPARTMENT_OTHER)
Admission: RE | Disposition: A | Discharge status: Home / Self Care | Payer: Self-pay | Source: Home / Self Care | Attending: Urology

## 2022-06-28 ENCOUNTER — Other Ambulatory Visit: Payer: Self-pay

## 2022-06-28 ENCOUNTER — Ambulatory Visit (HOSPITAL_BASED_OUTPATIENT_CLINIC_OR_DEPARTMENT_OTHER)
Admission: RE | Admit: 2022-06-28 | Discharge: 2022-06-28 | Disposition: A | Discharge status: Home / Self Care | Payer: Medicare HMO | Attending: Urology | Admitting: Urology

## 2022-06-28 ENCOUNTER — Ambulatory Visit (HOSPITAL_BASED_OUTPATIENT_CLINIC_OR_DEPARTMENT_OTHER): Payer: Medicare HMO | Admitting: Anesthesiology

## 2022-06-28 ENCOUNTER — Ambulatory Visit (HOSPITAL_COMMUNITY): Payer: Medicare HMO

## 2022-06-28 DIAGNOSIS — C61 Malignant neoplasm of prostate: Secondary | ICD-10-CM | POA: Diagnosis not present

## 2022-06-28 DIAGNOSIS — Z191 Hormone sensitive malignancy status: Secondary | ICD-10-CM | POA: Diagnosis not present

## 2022-06-28 DIAGNOSIS — Z01818 Encounter for other preprocedural examination: Secondary | ICD-10-CM

## 2022-06-28 HISTORY — DX: Systemic involvement of connective tissue, unspecified: M35.9

## 2022-06-28 HISTORY — PX: RADIOACTIVE SEED IMPLANT: SHX5150

## 2022-06-28 HISTORY — PX: SPACE OAR INSTILLATION: SHX6769

## 2022-06-28 SURGERY — INSERTION, RADIATION SOURCE, PROSTATE
Anesthesia: General | Site: Perineum

## 2022-06-28 MED ORDER — PROPOFOL 10 MG/ML IV BOLUS
INTRAVENOUS | Status: AC
  Filled 2022-06-28: qty 20

## 2022-06-28 MED ORDER — LIDOCAINE HCL (PF) 2 % IJ SOLN
INTRAMUSCULAR | Status: AC
  Filled 2022-06-28: qty 5

## 2022-06-28 MED ORDER — OXYCODONE HCL 5 MG PO TABS: 5.0000 mg | ORAL_TABLET | Freq: Once | ORAL | Status: DC | PRN

## 2022-06-28 MED ORDER — MIDAZOLAM HCL 5 MG/5ML IJ SOLN
INTRAMUSCULAR | Status: DC | PRN
  Administered 2022-06-28: 1 mg via INTRAVENOUS

## 2022-06-28 MED ORDER — ROCURONIUM BROMIDE 10 MG/ML (PF) SYRINGE
PREFILLED_SYRINGE | INTRAVENOUS | Status: AC
  Filled 2022-06-28: qty 10

## 2022-06-28 MED ORDER — ONDANSETRON HCL 4 MG/2ML IJ SOLN: 4.0000 mg | Freq: Once | INTRAMUSCULAR | Status: DC | PRN

## 2022-06-28 MED ORDER — LACTATED RINGERS IV SOLN: INTRAVENOUS | Status: DC

## 2022-06-28 MED ORDER — SUGAMMADEX SODIUM 200 MG/2ML IV SOLN
INTRAVENOUS | Status: DC | PRN
  Administered 2022-06-28: 200 mg via INTRAVENOUS

## 2022-06-28 MED ORDER — LIDOCAINE 2% (20 MG/ML) 5 ML SYRINGE
INTRAMUSCULAR | Status: DC | PRN
  Administered 2022-06-28: 40 mg via INTRAVENOUS

## 2022-06-28 MED ORDER — IOHEXOL 300 MG/ML  SOLN
INTRAMUSCULAR | Status: DC | PRN
  Administered 2022-06-28: 10 mL

## 2022-06-28 MED ORDER — FENTANYL CITRATE (PF) 100 MCG/2ML IJ SOLN
INTRAMUSCULAR | Status: AC
  Filled 2022-06-28: qty 2

## 2022-06-28 MED ORDER — ONDANSETRON HCL 4 MG/2ML IJ SOLN
INTRAMUSCULAR | Status: AC
  Filled 2022-06-28: qty 2

## 2022-06-28 MED ORDER — OXYCODONE HCL 5 MG/5ML PO SOLN: 5.0000 mg | Freq: Once | ORAL | Status: DC | PRN

## 2022-06-28 MED ORDER — CEFAZOLIN SODIUM-DEXTROSE 2-4 GM/100ML-% IV SOLN
2.0000 g | Freq: Once | INTRAVENOUS | Status: AC
  Administered 2022-06-28: 2 g via INTRAVENOUS

## 2022-06-28 MED ORDER — SODIUM CHLORIDE (PF) 0.9 % IJ SOLN
INTRAMUSCULAR | Status: AC
  Filled 2022-06-28: qty 50

## 2022-06-28 MED ORDER — MIDAZOLAM HCL 2 MG/2ML IJ SOLN
INTRAMUSCULAR | Status: AC
  Filled 2022-06-28: qty 2

## 2022-06-28 MED ORDER — EPHEDRINE SULFATE-NACL 50-0.9 MG/10ML-% IV SOSY
PREFILLED_SYRINGE | INTRAVENOUS | Status: DC | PRN
  Administered 2022-06-28: 5 mg via INTRAVENOUS

## 2022-06-28 MED ORDER — PROPOFOL 10 MG/ML IV BOLUS
INTRAVENOUS | Status: DC | PRN
  Administered 2022-06-28: 100 mg via INTRAVENOUS

## 2022-06-28 MED ORDER — GLYCOPYRROLATE 0.2 MG/ML IJ SOLN
INTRAMUSCULAR | Status: DC | PRN
  Administered 2022-06-28: .2 mg via INTRAVENOUS

## 2022-06-28 MED ORDER — DEXAMETHASONE SODIUM PHOSPHATE 10 MG/ML IJ SOLN
INTRAMUSCULAR | Status: AC
  Filled 2022-06-28: qty 1

## 2022-06-28 MED ORDER — FENTANYL CITRATE (PF) 100 MCG/2ML IJ SOLN
INTRAMUSCULAR | Status: DC | PRN
  Administered 2022-06-28: 50 ug via INTRAVENOUS

## 2022-06-28 MED ORDER — DEXAMETHASONE SODIUM PHOSPHATE 10 MG/ML IJ SOLN
INTRAMUSCULAR | Status: DC | PRN
  Administered 2022-06-28: 5 mg via INTRAVENOUS

## 2022-06-28 MED ORDER — ONDANSETRON HCL 4 MG/2ML IJ SOLN
INTRAMUSCULAR | Status: DC | PRN
  Administered 2022-06-28: 4 mg via INTRAVENOUS

## 2022-06-28 MED ORDER — FLEET ENEMA 7-19 GM/118ML RE ENEM: 1.0000 | ENEMA | Freq: Once | RECTAL | Status: DC

## 2022-06-28 MED ORDER — CEFAZOLIN SODIUM-DEXTROSE 2-4 GM/100ML-% IV SOLN
INTRAVENOUS | Status: AC
  Filled 2022-06-28: qty 100

## 2022-06-28 MED ORDER — SODIUM CHLORIDE 0.9 % IV SOLN
INTRAVENOUS | Status: AC | PRN
  Administered 2022-06-28: 1000 mL

## 2022-06-28 MED ORDER — FENTANYL CITRATE (PF) 100 MCG/2ML IJ SOLN: 25.0000 ug | INTRAMUSCULAR | Status: DC | PRN

## 2022-06-28 MED ORDER — ACETAMINOPHEN 500 MG PO TABS
1000.0000 mg | ORAL_TABLET | Freq: Once | ORAL | Status: AC
Start: 2022-06-28 — End: 2022-06-28
  Administered 2022-06-28: 1000 mg via ORAL

## 2022-06-28 MED ORDER — ACETAMINOPHEN 500 MG PO TABS
ORAL_TABLET | ORAL | Status: AC
  Filled 2022-06-28: qty 2

## 2022-06-28 MED ORDER — AMISULPRIDE (ANTIEMETIC) 5 MG/2ML IV SOLN: 10.0000 mg | Freq: Once | INTRAVENOUS | Status: DC | PRN

## 2022-06-28 MED ORDER — ROCURONIUM BROMIDE 100 MG/10ML IV SOLN
INTRAVENOUS | Status: DC | PRN
  Administered 2022-06-28: 10 mg via INTRAVENOUS
  Administered 2022-06-28: 60 mg via INTRAVENOUS

## 2022-06-28 SURGICAL SUPPLY — 46 items
BAG DRN RND TRDRP ANRFLXCHMBR (UROLOGICAL SUPPLIES) ×1
BAG URINE DRAIN 2000ML AR STRL (UROLOGICAL SUPPLIES) ×2 IMPLANT
BLADE CLIPPER SENSICLIP SURGIC (BLADE) ×2 IMPLANT
CATH FOLEY 2WAY SLVR  5CC 16FR (CATHETERS) ×2
CATH FOLEY 2WAY SLVR 5CC 16FR (CATHETERS) ×1 IMPLANT
CATH ROBINSON RED A/P 16FR (CATHETERS) IMPLANT
CATH ROBINSON RED A/P 20FR (CATHETERS) ×2 IMPLANT
CLOTH BEACON ORANGE TIMEOUT ST (SAFETY) ×2 IMPLANT
CNTNR URN SCR LID CUP LEK RST (MISCELLANEOUS) ×1 IMPLANT
CONT SPEC 4OZ STRL OR WHT (MISCELLANEOUS) ×2
COVER BACK TABLE 60X90IN (DRAPES) ×2 IMPLANT
COVER MAYO STAND STRL (DRAPES) ×2 IMPLANT
DRSG TEGADERM 4X4.75 (GAUZE/BANDAGES/DRESSINGS) ×2 IMPLANT
DRSG TEGADERM 8X12 (GAUZE/BANDAGES/DRESSINGS) ×2 IMPLANT
GEL ULTRASOUND 20GR AQUASONIC (MISCELLANEOUS) ×2 IMPLANT
GLOVE BIO SURGEON STRL SZ 6.5 (GLOVE) ×2 IMPLANT
GLOVE BIO SURGEON STRL SZ7.5 (GLOVE) IMPLANT
GLOVE BIO SURGEON STRL SZ8 (GLOVE) ×4 IMPLANT
GLOVE BIOGEL PI IND STRL 6.5 (GLOVE) IMPLANT
GLOVE BIOGEL PI INDICATOR 6.5 (GLOVE)
GLOVE SURG ORTHO 8.5 STRL (GLOVE) ×2 IMPLANT
GLOVE SURG SS PI 6.5 STRL IVOR (GLOVE) IMPLANT
GOWN STRL REUS W/TWL XL LVL3 (GOWN DISPOSABLE) ×2 IMPLANT
GRID BRACH TEMP 18GA 2.8X3X.75 (MISCELLANEOUS) ×2 IMPLANT
HOLDER FOLEY CATH W/STRAP (MISCELLANEOUS) ×2 IMPLANT
IMPL SPACEOAR VUE SYSTEM (Spacer) ×1 IMPLANT
IMPLANT SPACEOAR VUE SYSTEM (Spacer) ×2 IMPLANT
IV NS 1000ML (IV SOLUTION) ×2
IV NS 1000ML BAXH (IV SOLUTION) ×1 IMPLANT
KIT TURNOVER CYSTO (KITS) ×2 IMPLANT
NDL BRACHY 18G 5PK (NEEDLE) ×4 IMPLANT
NDL BRACHY 18G SINGLE (NEEDLE) IMPLANT
NDL PK MORGANSTERN STABILIZ (NEEDLE) ×1 IMPLANT
NEEDLE BRACHY 18G 5PK (NEEDLE) ×8 IMPLANT
NEEDLE BRACHY 18G SINGLE (NEEDLE) IMPLANT
NEEDLE PK MORGANSTERN STABILIZ (NEEDLE) ×2 IMPLANT
PACK CYSTO (CUSTOM PROCEDURE TRAY) ×2 IMPLANT
QuickLink Delivery System ×1 IMPLANT
SHEATH ULTRASOUND LF (SHEATH) IMPLANT
SHEATH ULTRASOUND LTX NONSTRL (SHEATH) IMPLANT
SUT BONE WAX W31G (SUTURE) IMPLANT
SYR 10ML LL (SYRINGE) ×2 IMPLANT
SYR CONTROL 10ML LL (SYRINGE) ×2 IMPLANT
TOWEL OR 17X26 10 PK STRL BLUE (TOWEL DISPOSABLE) ×2 IMPLANT
UNDERPAD 30X36 HEAVY ABSORB (UNDERPADS AND DIAPERS) ×4 IMPLANT
WATER STERILE IRR 500ML POUR (IV SOLUTION) ×2 IMPLANT

## 2022-06-28 NOTE — Progress Notes (Signed)
  Radiation Oncology         [phone] ________________________________  Name: Nicholas Santos MRN: [id number]  Date: 06/28/2022  DOB: October 02, 1946       Prostate Seed Implant  NT:IRWERX, Misty Stanley, MD  No ref. provider found  DIAGNOSIS:  76 y.o. gentleman with Stage T1c adenocarcinoma of the prostate with Gleason score of 3+4, and PSA of 7.33.  Oncology History  Malignant neoplasm of prostate ([facility])  03/08/2022 Cancer Staging   Staging form: Prostate, AJCC 8th Edition - Clinical stage from 03/08/2022: Stage IIB (cT1c, cN0, cM0, PSA: 7.3, Grade Group: 2) - Signed by Marcello Fennel, PA-C on 03/19/2022 Histopathologic type: Adenocarcinoma, NOS Stage prefix: Initial diagnosis Prostate specific antigen (PSA) range: Less than 10 Gleason primary pattern: 3 Gleason secondary pattern: 4 Gleason score: 7 Histologic grading system: 5 grade system Number of biopsy cores examined: 16 Number of biopsy cores positive: 4 Location of positive needle core biopsies: One side   03/19/2022 Initial Diagnosis   Malignant neoplasm of prostate ([facility])       ICD-10-CM   1. Pre-op testing  Z01.818 CBG per Guidelines for Diabetes Management for Patients Undergoing Surgery ([facility], [facility], and [facility] only)    CBG per protocol      PROCEDURE: Insertion of radioactive I-125 seeds into the prostate gland.  RADIATION DOSE: 145 Gy, definitive therapy.  TECHNIQUE: Nicholas Santos was brought to the operating room with the urologist. He was placed in the dorsolithotomy position. He was catheterized and a rectal tube was inserted. The perineum was shaved, prepped and draped. The ultrasound probe was then introduced into the rectum to see the prostate gland.  TREATMENT DEVICE: A needle grid was attached to the ultrasound probe stand and anchor needles were placed.  3D PLANNING: The prostate was imaged in 3D using a sagittal sweep of the prostate probe. These images were transferred to the planning computer. There, the prostate,  urethra and rectum were defined on each axial reconstructed image. Then, the software created an optimized 3D plan and a few seed positions were adjusted. The quality of the plan was reviewed using [facility] information for the target and the following two organs at risk:  Urethra and Rectum.  Then the accepted plan was printed and handed off to the radiation therapist.  Under my supervision, the custom loading of the seeds and spacers was carried out and loaded into sealed vicryl sleeves.  These pre-loaded needles were then placed into the needle holder.Marland Kitchen  PROSTATE VOLUME STUDY:  Using transrectal ultrasound the volume of the prostate was verified to be 52.6 cc.  SPECIAL TREATMENT PROCEDURE/SUPERVISION AND HANDLING: The pre-loaded needles were then delivered under sagittal guidance. A total of 13 needles were used to deposit 63 seeds in the prostate gland. The individual seed activity was 0.544 mCi.  SpaceOAR:  Yes  COMPLEX SIMULATION: At the end of the procedure, an anterior radiograph of the pelvis was obtained to document seed positioning and count. Cystoscopy was performed to check the urethra and bladder.  MICRODOSIMETRY: At the end of the procedure, the patient was emitting 0.09 mR/hr at 1 meter. Accordingly, he was considered safe for hospital discharge.  PLAN: The patient will return to the radiation oncology clinic for post implant CT dosimetry in three weeks.   ________________________________  Artist Pais Kathrynn Running, M.D.

## 2022-06-28 NOTE — Transfer of Care (Signed)
Immediate Anesthesia Transfer of Care Note  Patient: Nicholas Santos  Procedure(s) Performed: RADIOACTIVE SEED IMPLANT/BRACHYTHERAPY IMPLANT (Perineum) SPACE OAR INSTILLATION (Perineum)  Patient Location: PACU  Anesthesia Type:General  Level of Consciousness: drowsy and patient cooperative  Airway & Oxygen Therapy: Patient Spontanous Breathing  Post-op Assessment: Report given to RN and Post -op Vital signs reviewed and stable  Post vital signs: Reviewed and stable  Last Vitals:  Vitals Value Taken Time  BP    Temp    Pulse 63 06/28/22 1307  Resp 21 06/28/22 1307  SpO2 99 % 06/28/22 1307  Vitals shown include unvalidated device data.  Last Pain:  Vitals:   06/28/22 0957  TempSrc: Oral  PainSc: 0-No pain      Patients Stated Pain Goal: 5 (17/00/17 4944)  Complications: No notable events documented.

## 2022-06-28 NOTE — H&P (Signed)
H&P  Chief Complaint: Prostate cancer  History of Present Illness: 76 year old male presents at this time for I-125 brachytherapy and SpaceOAR placement for prostate cancer.  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 No pertinent family history.  Social History:  reports that he has never smoked. He has never used smokeless tobacco. He reports current alcohol use of about 1.0 standard drink of alcohol per week. He reports that he does not use drugs.  ROS: A complete review of systems was performed.  All systems are negative except for pertinent findings as noted.  Physical Exam:  Vital signs in last 24 hours: BP (!) 152/79   Pulse (!) 59   Temp 97.9 F (36.6 C) (Oral)   Resp 16   Ht 5' 8.5" (1.74 m)   Wt 69.8 kg   SpO2 97%   BMI 23.06 kg/m  Constitutional:  Alert and oriented, No acute distress Cardiovascular: Regular rate  Respiratory: Normal respiratory effort Neurologic: Grossly intact, no focal deficits Psychiatric: Normal mood and affect  I have reviewed prior pt notes  I have reviewed notes from referring/previous physicians  I have reviewed urinalysis results    Impression/Assessment:  Grade group 2  adenocarcinoma the prostate  Plan:  I-125 brachytherapy, SpaceOAR placement

## 2022-06-28 NOTE — Anesthesia Postprocedure Evaluation (Signed)
Anesthesia Post Note  Patient: Nicholas Santos  Procedure(s) Performed: RADIOACTIVE SEED IMPLANT/BRACHYTHERAPY IMPLANT (Perineum) SPACE OAR INSTILLATION (Perineum)     Patient location during evaluation: PACU Anesthesia Type: General Level of consciousness: awake and alert Pain management: pain level controlled Vital Signs Assessment: post-procedure vital signs reviewed and stable Respiratory status: spontaneous breathing, nonlabored ventilation and respiratory function stable Cardiovascular status: blood pressure returned to baseline and stable Postop Assessment: no apparent nausea or vomiting Anesthetic complications: no   No notable events documented.  Last Vitals:  Vitals:   06/28/22 1340 06/28/22 1342  BP:    Pulse: (!) 51 (!) 52  Resp: 18 17  Temp:  (!) 36.3 C  SpO2: 97% 96%    Last Pain:  Vitals:   06/28/22 1342  TempSrc: Oral  PainSc:                  Lidia Collum

## 2022-06-28 NOTE — Anesthesia Preprocedure Evaluation (Signed)
Anesthesia Evaluation  Patient identified by MRN, date of birth, ID band Patient awake    Reviewed: Allergy & Precautions, NPO status , Patient's Chart, lab work & pertinent test results  History of Anesthesia Complications Negative for: history of anesthetic complications  Airway Mallampati: II  TM Distance: >3 FB Neck ROM: Full    Dental  (+) Teeth Intact, Dental Advisory Given   Pulmonary neg pulmonary ROS,    Pulmonary exam normal        Cardiovascular negative cardio ROS Normal cardiovascular exam     Neuro/Psych negative neurological ROS     GI/Hepatic negative GI ROS, Neg liver ROS,   Endo/Other  negative endocrine ROS  Renal/GU negative Renal ROS   Prostate cancer    Musculoskeletal negative musculoskeletal ROS (+)   Abdominal   Peds  Hematology negative hematology ROS (+)   Anesthesia Other Findings   Reproductive/Obstetrics                             Anesthesia Physical Anesthesia Plan  ASA: 2  Anesthesia Plan: General   Post-op Pain Management: Tylenol PO (pre-op)* and Toradol IV (intra-op)*   Induction: Intravenous  PONV Risk Score and Plan: 2 and Ondansetron, Dexamethasone, Treatment may vary due to age or medical condition and Midazolam  Airway Management Planned: Oral ETT  Additional Equipment: None  Intra-op Plan:   Post-operative Plan: Extubation in OR  Informed Consent: I have reviewed the patients History and Physical, chart, labs and discussed the procedure including the risks, benefits and alternatives for the proposed anesthesia with the patient or authorized representative who has indicated his/her understanding and acceptance.     Dental advisory given  Plan Discussed with:   Anesthesia Plan Comments:         Anesthesia Quick Evaluation

## 2022-06-28 NOTE — Discharge Instructions (Addendum)
Radioactive Seed Implant Home Care Instructions   Activity:    Rest for the remainder of the day.  Do not drive or operate equipment today.  You may resume normal  activities in a few days as instructed by your physician, without risk of harmful radiation exposure to those around you, provided you follow the time and distance precautions on the Radiation Oncology Instruction Sheet.   Meals: Drink plenty of lipuids and eat light foods, such as gelatin or soup this evening .  You may return to normal meal plan tomorrow.  Return To Work: You may return to work as instructed by your physician.  Special Instruction:   If any seeds are found, use tweezers to pick up seeds and place in a glass container of any kind and bring to your physician's office.  Call your physician if any of these symptoms occur:  Persistent or heavy bleeding Urine stream diminishes or stops completely after catheter is removed Fever equal to or greater than 101 degrees F Cloudy urine with a strong foul odor Severe pain  You may feel some burning pain and/or hesitancy when you urinate after the catheter is removed.  These symptoms may increase over the next few weeks, but should diminish within forur to six weeks.  Applying moist heat to the lower abdomen or a hot tub bath may help relieve the pain.  If the discomfort becomes severe, please call your physician for additional medications.   Post Anesthesia Home Care Instructions  Activity: Get plenty of rest for the remainder of the day. A responsible adult should stay with you for 24 hours following the procedure.  For the next 24 hours, DO NOT: -Drive a car -Operate machinery -Drink alcoholic beverages -Take any medication unless instructed by your physician -Make any legal decisions or sign important papers.  Meals: Start with liquid foods such as gelatin or soup. Progress to regular foods as tolerated. Avoid greasy, spicy, heavy foods. If nausea and/or  vomiting occur, drink only clear liquids until the nausea and/or vomiting subsides. Call your physician if vomiting continues.  Special Instructions/Symptoms: Your throat may feel dry or sore from the anesthesia or the breathing tube placed in your throat during surgery. If this causes discomfort, gargle with warm salt water. The discomfort should disappear within 24 hours.  

## 2022-06-28 NOTE — Anesthesia Procedure Notes (Signed)
Procedure Name: Intubation Date/Time: 06/28/2022 11:58 AM  Performed by: Gwyndolyn Saxon, CRNAPre-anesthesia Checklist: Patient identified, Emergency Drugs available, Suction available and Patient being monitored Patient Re-evaluated:Patient Re-evaluated prior to induction Oxygen Delivery Method: Circle system utilized Preoxygenation: Pre-oxygenation with 100% oxygen Induction Type: IV induction Ventilation: Mask ventilation without difficulty Laryngoscope Size: Miller and 2 Grade View: Grade I Tube type: Oral Tube size: 7.5 mm Number of attempts: 1 Airway Equipment and Method: Patient positioned with wedge pillow and Stylet Placement Confirmation: ETT inserted through vocal cords under direct vision, positive ETCO2 and breath sounds checked- equal and bilateral Secured at: 23 cm Tube secured with: Tape Dental Injury: Teeth and Oropharynx as per pre-operative assessment

## 2022-06-28 NOTE — Op Note (Signed)
Preoperative diagnosis: Clinical stage TI C adenocarcinoma the prostate   Postoperative diagnosis: Same   Procedure: I-125 prostate seed implantation, SpaceOAR placement, flexible cystoscopy  Surgeon: Lillette Boxer. Ignacia Gentzler M.D.  Radiation Oncologist: Tyler Pita, M.D.  Anesthesia: Gen.   Indications: Patient  was diagnosed with clinical stage TI C prostate cancer. We had extensive discussion with him about treatment options versus. He elected to proceed with seed implantation. He underwent consultation my office as well as with Dr. Tammi Klippel. He appeared to understand the advantages disadvantages potential risks of this treatment option. Full informed consent has been obtained.   Technique and findings: Patient was brought the operating room where he had successful induction of general anesthesia. He was placed in dorso-lithotomy position and prepped and draped in usual manner. Appropriate surgical timeout was performed. Radiation oncology department placed a transrectal ultrasound probe anchoring stand. Foley catheter with contrast in the balloon was inserted without difficulty. Anchoring needles were placed within the prostate. Rectal tube was placed. Real-time contouring of the urethra prostate and rectum were performed and the dosing parameters were established. Targeted dose was 145 gray.  I was then called  to the operating suite suite for placement of the needles. A second timeout was performed. All needle passage was done with real-time transrectal ultrasound guidance with the sagittal plane. A total of 13 needles were placed.  63 active seeds were implanted.  I then proceeded with placement of SpaceOAR by introducing a needle with the bevel angled inferiorly approximately 2 cm superior to the anus. This was angled downward and under direct ultrasound was placed within the space between the prostatic capsule and rectum. This was confirmed with a small amount of sterile saline injected and  this was performed under direct ultrasound. I then attached the SpaceOAR to the needle and injected this in the space between the prostate and rectum with good placement noted. The Foley catheter was removed and flexible cystoscopy failed to show any seeds outside the prostate.  Non-obstructive prostate, bladder urothelium nml. The patient was brought to recovery room in stable condition, having tolerated the procedure well.Marland Kitchen

## 2022-07-01 ENCOUNTER — Encounter (HOSPITAL_BASED_OUTPATIENT_CLINIC_OR_DEPARTMENT_OTHER): Payer: Self-pay | Admitting: Urology

## 2022-07-08 DIAGNOSIS — R351 Nocturia: Secondary | ICD-10-CM | POA: Diagnosis not present

## 2022-07-08 DIAGNOSIS — R8271 Bacteriuria: Secondary | ICD-10-CM | POA: Diagnosis not present

## 2022-07-08 DIAGNOSIS — N401 Enlarged prostate with lower urinary tract symptoms: Secondary | ICD-10-CM | POA: Diagnosis not present

## 2022-07-08 DIAGNOSIS — R350 Frequency of micturition: Secondary | ICD-10-CM | POA: Diagnosis not present

## 2022-07-17 ENCOUNTER — Telehealth: Payer: Self-pay | Admitting: *Deleted

## 2022-07-17 NOTE — Telephone Encounter (Signed)
Called patient to remind of post seed appts. for 07-19-22, lvm for a return call

## 2022-07-19 ENCOUNTER — Ambulatory Visit: Payer: Medicare HMO | Admitting: Radiation Oncology

## 2022-07-19 ENCOUNTER — Ambulatory Visit
Admission: RE | Admit: 2022-07-19 | Discharge: 2022-07-19 | Disposition: A | Discharge status: Home / Self Care | Payer: Medicare HMO | Source: Ambulatory Visit | Attending: Urology | Admitting: Urology

## 2022-07-19 ENCOUNTER — Other Ambulatory Visit: Payer: Self-pay

## 2022-07-19 ENCOUNTER — Ambulatory Visit
Admission: RE | Admit: 2022-07-19 | Discharge: 2022-07-19 | Disposition: A | Discharge status: Home / Self Care | Payer: Medicare HMO | Source: Ambulatory Visit | Attending: Radiation Oncology | Admitting: Radiation Oncology

## 2022-07-19 ENCOUNTER — Encounter: Payer: Self-pay | Admitting: Urology

## 2022-07-19 VITALS — BP 121/74 | HR 53 | Temp 97.6°F | Resp 20 | Ht 68.5 in | Wt 157.0 lb

## 2022-07-19 DIAGNOSIS — C61 Malignant neoplasm of prostate: Secondary | ICD-10-CM | POA: Insufficient documentation

## 2022-07-19 NOTE — Progress Notes (Signed)
  Radiation Oncology         [phone] ________________________________  Name: Nicholas Santos MRN: [id number]  Date: 07/19/2022  DOB: 13-Aug-1946  COMPLEX SIMULATION NOTE  NARRATIVE:  The patient was brought to the [facility] today following prostate seed implantation approximately one month ago.  Identity was confirmed.  All relevant records and images related to the planned course of therapy were reviewed.  Then, the patient was set-up supine.  CT images were obtained.  The CT images were loaded into the planning software.  Then the prostate and rectum were contoured.  Treatment planning then occurred.  The implanted iodine 125 seeds were identified by the physics staff for projection of radiation distribution  I have requested : 3D Simulation  I have requested a DVH of the following structures: Prostate and rectum.    ________________________________  Sheral Apley Tammi Klippel, M.D.

## 2022-07-19 NOTE — Progress Notes (Addendum)
Post-seed appointment. I verified patient's identity and began nursing interview. Patient reports urinary discomfort. No other issues reported at this time.  Meaningful use complete. I-PSS score of 34- severe. Flomax as directed. Urology appt- Oct 28th, 2023  BP 121/74 (BP Location: Right Arm, Patient Position: Sitting, Cuff Size: Normal)   Pulse (!) 53   Temp 97.6 F (36.4 C) (Temporal)   Resp 20   Ht 5' 8.5" (1.74 m)   Wt 157 lb (71.2 kg)   SpO2 97%   BMI 23.52 kg/m

## 2022-07-19 NOTE — Progress Notes (Signed)
Radiation Oncology         [phone] ________________________________  Name: Nicholas Santos MRN: [id number]  Date: 07/19/2022  DOB: 23-Jun-1946  Post-Seed Follow-Up Visit Note  CC: Kathyrn Lass, MD  Franchot Gallo, MD  Diagnosis:   76 y.o. gentleman with Stage T1c adenocarcinoma of the prostate with Gleason score of 3+4, and PSA of 7.33.    ICD-10-CM   1. Malignant neoplasm of prostate (HCC)  C61       Interval Since Last Radiation:  3 weeks 06/28/22:  Insertion of radioactive I-125 seeds into the prostate gland; 145 Gy, definitive therapy with placement of SpaceOAR gel.  Narrative:  The patient returns today for routine follow-up.  He is complaining of increased urinary frequency and urinary hesitation symptoms. He filled out a questionnaire regarding urinary function today providing and overall IPSS score of 34 characterizing his symptoms as severe.  His pre-implant score was 7. He denies any abdominal pain or bowel symptoms.  He had a follow-up visit with Dr. Diona Fanti on 07/08/2022 due to increased frequency, urgency, weak flow of stream, hesitancy and incomplete bladder emptying despite taking 2 Flomax capsules daily as directed.  Fortunately, his PVR in the office that day was low and urine C&S was negative so he was advised to continue taking the Flomax 0.8 mg daily and ibuprofen 600 mg 3 times daily as needed.  He had a follow-up visit with Jiles Crocker, NP earlier this morning and has not gotten much, if any, relief with the Flomax so he is going to switch to Rapaflo to see if that works better. He will also start using Miralax to help with some constipation.  ALLERGIES:  is allergic to meperidine hcl.  Meds: Current Outpatient Medications  Medication Sig Dispense Refill   PRESCRIPTION MEDICATION QID. Mouthwash prescribed for mucus membrane phemphigoid.     sildenafil (REVATIO) 20 MG tablet SMARTSIG:1-5 Tablet(s) By Mouth Daily PRN     simvastatin (ZOCOR) 20 MG tablet Take  20 mg by mouth every evening.     tamsulosin (FLOMAX) 0.4 MG CAPS capsule Take 0.4 mg by mouth daily.     No current facility-administered medications for this visit.    Physical Findings: In general this is a well appearing Caucasian male in no acute distress. He's alert and oriented x4 and appropriate throughout the examination. Cardiopulmonary assessment is negative for acute distress and he exhibits normal effort.   Lab Findings: No results found for: "WBC", "HGB", "HCT", "MCV", "PLT"  Radiographic Findings:  Patient underwent CT imaging in our clinic for post implant dosimetry. The CT will be reviewed by Dr. Tammi Klippel to confirm there is an adequate distribution of radioactive seeds throughout the prostate gland and ensure that there are no seeds in or near the rectum.  We suspect the final radiation plan and dosimetry will show appropriate coverage of the prostate gland. He understands that we will call and inform him of any unexpected findings on further review of his imaging and dosimetry.  Impression/Plan: 76 y.o. gentleman with Stage T1c adenocarcinoma of the prostate with Gleason score of 3+4, and PSA of 7.33. The patient is recovering from the effects of radiation. His urinary symptoms should gradually improve over the next 4-6 months. We talked about this today. He is struggling with significant LUTS right now but is going to switch his alpha blocker to Rapaflo in hopes that he will get some relief. He is otherwise pleased with his outcome. We also talked about long-term follow-up for prostate  cancer following seed implant. He understands that ongoing PSA determinations and digital rectal exams will help perform surveillance to rule out disease recurrence. He has a follow up appointment scheduled with Dr. Diona Fanti on 09/27/22. He understands what to expect with his PSA measures. Patient was also educated today about some of the long-term effects from radiation including a small risk for  rectal bleeding and possibly erectile dysfunction. We talked about some of the general management approaches to these potential complications. However, I did encourage the patient to contact our office or return at any point if he has questions or concerns related to his previous radiation and prostate cancer.    Nicholos Johns, PA-C

## 2022-08-01 ENCOUNTER — Encounter: Payer: Self-pay | Admitting: Radiation Oncology

## 2022-08-01 ENCOUNTER — Ambulatory Visit
Admission: RE | Admit: 2022-08-01 | Discharge: 2022-08-01 | Disposition: A | Discharge status: Home / Self Care | Payer: Medicare HMO | Source: Ambulatory Visit | Attending: Radiation Oncology | Admitting: Radiation Oncology

## 2022-08-01 DIAGNOSIS — C61 Malignant neoplasm of prostate: Secondary | ICD-10-CM | POA: Diagnosis not present

## 2022-08-01 DIAGNOSIS — Z191 Hormone sensitive malignancy status: Secondary | ICD-10-CM | POA: Diagnosis not present

## 2022-08-07 NOTE — Progress Notes (Signed)
  Radiation Oncology         ([phone]) [phone] ________________________________  Name: Taejon Irani MRN: [id number]  Date: 08/01/2022  DOB: Jan 11, 1946  3D Planning Note   Prostate Brachytherapy Post-Implant Dosimetry  Diagnosis: 76 y.o. gentleman with Stage T1c adenocarcinoma of the prostate with Gleason score of 3+4, and PSA of 7.33.  Narrative: On a previous date, Kentrel Clevenger returned following prostate seed implantation for post implant planning. He underwent CT scan complex simulation to delineate the three-dimensional structures of the pelvis and demonstrate the radiation distribution.  Since that time, the seed localization, and complex isodose planning with dose volume histograms have now been completed.  Results:   Prostate Coverage - The dose of radiation delivered to the 90% or more of the prostate gland (D90) was 119.22% of the prescription dose. This exceeds our goal of greater than 90%. Rectal Sparing - The volume of rectal tissue receiving the prescription dose or higher was 0.0 cc. This falls under our thresholds tolerance of 1.0 cc.  Impression: The prostate seed implant appears to show adequate target coverage and appropriate rectal sparing.  Plan:  The patient will continue to follow with urology for ongoing PSA determinations. I would anticipate a high likelihood for local tumor control with minimal risk for rectal morbidity.  ________________________________  Sheral Apley Tammi Klippel, M.D.

## 2022-08-19 ENCOUNTER — Encounter: Payer: Self-pay | Admitting: *Deleted

## 2022-08-23 ENCOUNTER — Encounter: Payer: Self-pay | Admitting: *Deleted

## 2022-09-01 DIAGNOSIS — R350 Frequency of micturition: Secondary | ICD-10-CM | POA: Diagnosis not present

## 2022-09-01 DIAGNOSIS — N3000 Acute cystitis without hematuria: Secondary | ICD-10-CM | POA: Diagnosis not present

## 2022-09-03 DIAGNOSIS — R338 Other retention of urine: Secondary | ICD-10-CM | POA: Diagnosis not present

## 2022-09-13 ENCOUNTER — Inpatient Hospital Stay: Payer: Medicare HMO | Attending: Adult Health | Admitting: *Deleted

## 2022-09-13 ENCOUNTER — Encounter: Payer: Self-pay | Admitting: *Deleted

## 2022-09-13 DIAGNOSIS — C61 Malignant neoplasm of prostate: Secondary | ICD-10-CM

## 2022-09-13 NOTE — Progress Notes (Signed)
  2 Identifiers used for verification purposes. No vitals were taken as this was not an in person visit. Pt denies pain and fatigue today. Pt is still having some difficulty urinating on his own.  Tamsulosin was changed to Silodosin 8 mg by Dr.Dahlstedt in July. Pt said he went in for a voiding trial and didn't do well. He went home with indwelling catheter for a few weeks. Bowel movements are back to normal. He had another voiding trial to see if he could urinate a significant amt, but was unable to. He will return today to [facility] to possibly have catheter re-inserted to give bladder a rest.He will f/u with Dr. Diona Fanti in Oct for PSA labs. Pt will see PCP in October as well. Pt sees dermatologist every 6 mos for skin checks. Pt has not resumed exercise as of yet, but is leaning towards it when urinary issues are resolved. Pt denies smoking and drinks rarely.   Vaccinations reviewed and will have record faxed from PCP. SCP reviewed and completed.

## 2022-09-16 DIAGNOSIS — R338 Other retention of urine: Secondary | ICD-10-CM | POA: Diagnosis not present

## 2022-09-25 DIAGNOSIS — C61 Malignant neoplasm of prostate: Secondary | ICD-10-CM | POA: Diagnosis not present

## 2022-09-25 DIAGNOSIS — R3914 Feeling of incomplete bladder emptying: Secondary | ICD-10-CM | POA: Diagnosis not present

## 2022-09-25 DIAGNOSIS — N401 Enlarged prostate with lower urinary tract symptoms: Secondary | ICD-10-CM | POA: Diagnosis not present

## 2022-10-02 DIAGNOSIS — Z0000 Encounter for general adult medical examination without abnormal findings: Secondary | ICD-10-CM | POA: Diagnosis not present

## 2022-10-02 DIAGNOSIS — R339 Retention of urine, unspecified: Secondary | ICD-10-CM | POA: Diagnosis not present

## 2022-10-02 DIAGNOSIS — N529 Male erectile dysfunction, unspecified: Secondary | ICD-10-CM | POA: Diagnosis not present

## 2022-10-02 DIAGNOSIS — Z978 Presence of other specified devices: Secondary | ICD-10-CM | POA: Diagnosis not present

## 2022-10-02 DIAGNOSIS — Z6824 Body mass index (BMI) 24.0-24.9, adult: Secondary | ICD-10-CM | POA: Diagnosis not present

## 2022-10-02 DIAGNOSIS — E7800 Pure hypercholesterolemia, unspecified: Secondary | ICD-10-CM | POA: Diagnosis not present

## 2022-10-02 DIAGNOSIS — Z1389 Encounter for screening for other disorder: Secondary | ICD-10-CM | POA: Diagnosis not present

## 2022-10-02 DIAGNOSIS — I700 Atherosclerosis of aorta: Secondary | ICD-10-CM | POA: Diagnosis not present

## 2022-10-02 DIAGNOSIS — Z8546 Personal history of malignant neoplasm of prostate: Secondary | ICD-10-CM | POA: Diagnosis not present

## 2022-10-02 DIAGNOSIS — Z23 Encounter for immunization: Secondary | ICD-10-CM | POA: Diagnosis not present

## 2022-10-02 DIAGNOSIS — R7303 Prediabetes: Secondary | ICD-10-CM | POA: Diagnosis not present

## 2022-10-09 DIAGNOSIS — Z6824 Body mass index (BMI) 24.0-24.9, adult: Secondary | ICD-10-CM | POA: Diagnosis not present

## 2022-10-09 DIAGNOSIS — S76012A Strain of muscle, fascia and tendon of left hip, initial encounter: Secondary | ICD-10-CM | POA: Diagnosis not present

## 2022-10-11 DIAGNOSIS — Z6824 Body mass index (BMI) 24.0-24.9, adult: Secondary | ICD-10-CM | POA: Diagnosis not present

## 2022-10-11 DIAGNOSIS — M461 Sacroiliitis, not elsewhere classified: Secondary | ICD-10-CM | POA: Diagnosis not present

## 2022-10-14 DIAGNOSIS — N401 Enlarged prostate with lower urinary tract symptoms: Secondary | ICD-10-CM | POA: Diagnosis not present

## 2022-10-14 DIAGNOSIS — R3914 Feeling of incomplete bladder emptying: Secondary | ICD-10-CM | POA: Diagnosis not present

## 2022-10-22 DIAGNOSIS — M545 Low back pain, unspecified: Secondary | ICD-10-CM | POA: Diagnosis not present

## 2022-10-23 DIAGNOSIS — R339 Retention of urine, unspecified: Secondary | ICD-10-CM | POA: Diagnosis not present

## 2022-10-30 DIAGNOSIS — L129 Pemphigoid, unspecified: Secondary | ICD-10-CM | POA: Diagnosis not present

## 2022-11-07 DIAGNOSIS — S2222XA Fracture of body of sternum, initial encounter for closed fracture: Secondary | ICD-10-CM | POA: Diagnosis not present

## 2022-11-07 DIAGNOSIS — R0789 Other chest pain: Secondary | ICD-10-CM | POA: Diagnosis not present

## 2022-11-07 DIAGNOSIS — M545 Low back pain, unspecified: Secondary | ICD-10-CM | POA: Diagnosis not present

## 2022-11-08 ENCOUNTER — Other Ambulatory Visit: Payer: Self-pay | Admitting: Sports Medicine

## 2022-11-08 DIAGNOSIS — S2222XA Fracture of body of sternum, initial encounter for closed fracture: Secondary | ICD-10-CM

## 2022-11-12 ENCOUNTER — Other Ambulatory Visit: Payer: Self-pay | Admitting: Sports Medicine

## 2022-11-12 ENCOUNTER — Ambulatory Visit
Admission: RE | Admit: 2022-11-12 | Discharge: 2022-11-12 | Disposition: A | Discharge status: Home / Self Care | Payer: Medicare HMO | Source: Ambulatory Visit | Attending: Sports Medicine | Admitting: Sports Medicine

## 2022-11-12 DIAGNOSIS — S2220XA Unspecified fracture of sternum, initial encounter for closed fracture: Secondary | ICD-10-CM | POA: Diagnosis not present

## 2022-11-12 DIAGNOSIS — I700 Atherosclerosis of aorta: Secondary | ICD-10-CM | POA: Diagnosis not present

## 2022-11-12 DIAGNOSIS — R339 Retention of urine, unspecified: Secondary | ICD-10-CM | POA: Diagnosis not present

## 2022-11-12 DIAGNOSIS — M545 Low back pain, unspecified: Secondary | ICD-10-CM

## 2022-11-12 DIAGNOSIS — J984 Other disorders of lung: Secondary | ICD-10-CM | POA: Diagnosis not present

## 2022-11-12 DIAGNOSIS — S2222XA Fracture of body of sternum, initial encounter for closed fracture: Secondary | ICD-10-CM

## 2022-11-12 DIAGNOSIS — S20219A Contusion of unspecified front wall of thorax, initial encounter: Secondary | ICD-10-CM | POA: Diagnosis not present

## 2022-11-12 MED ORDER — IOPAMIDOL (ISOVUE-300) INJECTION 61%
75.0000 mL | Freq: Once | INTRAVENOUS | Status: AC | PRN
  Administered 2022-11-12: 75 mL via INTRAVENOUS

## 2022-11-16 ENCOUNTER — Ambulatory Visit
Admission: RE | Admit: 2022-11-16 | Discharge: 2022-11-16 | Disposition: A | Discharge status: Home / Self Care | Payer: Medicare HMO | Source: Ambulatory Visit | Attending: Sports Medicine | Admitting: Sports Medicine

## 2022-11-16 DIAGNOSIS — M48061 Spinal stenosis, lumbar region without neurogenic claudication: Secondary | ICD-10-CM | POA: Diagnosis not present

## 2022-11-16 DIAGNOSIS — M545 Low back pain, unspecified: Secondary | ICD-10-CM | POA: Diagnosis not present

## 2022-11-16 DIAGNOSIS — M47816 Spondylosis without myelopathy or radiculopathy, lumbar region: Secondary | ICD-10-CM | POA: Diagnosis not present

## 2022-12-02 DIAGNOSIS — M7062 Trochanteric bursitis, left hip: Secondary | ICD-10-CM | POA: Diagnosis not present

## 2022-12-02 DIAGNOSIS — M25552 Pain in left hip: Secondary | ICD-10-CM | POA: Diagnosis not present

## 2022-12-02 DIAGNOSIS — S2220XD Unspecified fracture of sternum, subsequent encounter for fracture with routine healing: Secondary | ICD-10-CM | POA: Diagnosis not present

## 2022-12-04 ENCOUNTER — Encounter: Payer: Medicare HMO | Admitting: Thoracic Surgery (Cardiothoracic Vascular Surgery)

## 2022-12-04 DIAGNOSIS — C61 Malignant neoplasm of prostate: Secondary | ICD-10-CM | POA: Diagnosis not present

## 2022-12-04 DIAGNOSIS — R3914 Feeling of incomplete bladder emptying: Secondary | ICD-10-CM | POA: Diagnosis not present

## 2022-12-04 DIAGNOSIS — N401 Enlarged prostate with lower urinary tract symptoms: Secondary | ICD-10-CM | POA: Diagnosis not present

## 2022-12-11 DIAGNOSIS — R338 Other retention of urine: Secondary | ICD-10-CM | POA: Diagnosis not present

## 2022-12-12 ENCOUNTER — Other Ambulatory Visit: Payer: Medicare HMO

## 2022-12-27 DIAGNOSIS — Z86006 Personal history of melanoma in-situ: Secondary | ICD-10-CM | POA: Diagnosis not present

## 2022-12-27 DIAGNOSIS — L570 Actinic keratosis: Secondary | ICD-10-CM | POA: Diagnosis not present

## 2022-12-27 DIAGNOSIS — L578 Other skin changes due to chronic exposure to nonionizing radiation: Secondary | ICD-10-CM | POA: Diagnosis not present

## 2022-12-27 DIAGNOSIS — L821 Other seborrheic keratosis: Secondary | ICD-10-CM | POA: Diagnosis not present

## 2022-12-27 DIAGNOSIS — D225 Melanocytic nevi of trunk: Secondary | ICD-10-CM | POA: Diagnosis not present

## 2022-12-27 DIAGNOSIS — Z85828 Personal history of other malignant neoplasm of skin: Secondary | ICD-10-CM | POA: Diagnosis not present

## 2022-12-30 DIAGNOSIS — R339 Retention of urine, unspecified: Secondary | ICD-10-CM | POA: Diagnosis not present

## 2023-01-01 DIAGNOSIS — R339 Retention of urine, unspecified: Secondary | ICD-10-CM | POA: Diagnosis not present

## 2023-01-07 ENCOUNTER — Other Ambulatory Visit: Payer: Self-pay | Admitting: Urology

## 2023-01-21 ENCOUNTER — Encounter (HOSPITAL_BASED_OUTPATIENT_CLINIC_OR_DEPARTMENT_OTHER): Payer: Self-pay | Admitting: Urology

## 2023-01-26 NOTE — H&P (Signed)
H&P  Chief Complaint: Urinary retention  History of Present Illness: 77 yo male presents for Urolift for mgmt of BPH w/ continued retention. He underwent I 125 brachytherapy 7.7.2023 for GG 2 PCa. 3 months later he developed worsening LUTS and incomplete bladder emptying. He has been dependent on cic since then. UDS revealed nml voiding pressures. Cysto revealed obstruction from BPH. Pretreatment prostate volume 53 mL.We have decided on urolift to provide relief from his obstruction with the least incidence of long-term incontinence or stricture following XRT.  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   History of skin cancer 01/23/2022   face   Hyperlipidemia    Malignant neoplasm prostate ([place]) 02/2022   urologist--- dr Mayleigh Tetrault/  radiation oncologsit--- dr Tammi Klippel;  dx 03/ 2023,  Gleason 3+4   Self-catheterizes urinary bladder    per pt 5-6 times daily   Wears glasses     Past Surgical History:  Procedure Laterality Date   ANKLE FRACTURE SURGERY Right    around 1978   COLONOSCOPY  2018   RADIOACTIVE SEED IMPLANT N/A 06/28/2022   Procedure: RADIOACTIVE SEED IMPLANT/BRACHYTHERAPY IMPLANT;  Surgeon: Franchot Gallo, MD;  [facility];  Service: Urology;  Laterality: N/A;   SPACE OAR INSTILLATION N/A 06/28/2022   Procedure: SPACE OAR INSTILLATION;  Surgeon: Franchot Gallo, MD;  Location: [facility];  Service: Urology;  Laterality: N/A;    Home Medications:  Allergies as of 01/26/2023       Reactions   Demerol [meperidine Hcl] Nausea And Vomiting   Other reaction(s): Unknown         Medication List      Notice   Cannot display discharge medications because the patient has not yet been admitted.     Allergies:  Allergies  Allergen Reactions   Demerol [Meperidine Hcl] Nausea And Vomiting    Other reaction(s): Unknown    History reviewed. No pertinent family history.  Social History:  reports that he has never smoked. He has never used smokeless tobacco. He reports current alcohol use of about 1.0 standard drink of alcohol per week. He reports that he does not use drugs.  ROS: A complete review of systems was performed.  All systems are negative except for pertinent findings as noted.  Physical Exam:  Vital signs in last 24 hours: Ht 5' 8.5" (1.74 m)   Wt 70.3 kg   BMI 23.22 kg/m  Constitutional:  Alert and oriented, No acute distress Cardiovascular: Regular rate  Respiratory: Normal respiratory effort Psychiatric: Normal mood and affect  I have reviewed prior pt notes  I have reviewed urinalysis results  I have independently reviewed UDS  I have reviewed prior PSA/pathology results   Impression/Assessment:  BPH w/ urinary retention  Plan:  Urolift

## 2023-01-27 ENCOUNTER — Ambulatory Visit (HOSPITAL_BASED_OUTPATIENT_CLINIC_OR_DEPARTMENT_OTHER): Payer: Medicare HMO | Admitting: Certified Registered"

## 2023-01-27 ENCOUNTER — Encounter (HOSPITAL_BASED_OUTPATIENT_CLINIC_OR_DEPARTMENT_OTHER)
Admission: RE | Disposition: A | Discharge status: Home / Self Care | Payer: Self-pay | Source: Home / Self Care | Attending: Urology

## 2023-01-27 ENCOUNTER — Encounter (HOSPITAL_BASED_OUTPATIENT_CLINIC_OR_DEPARTMENT_OTHER): Payer: Self-pay | Admitting: Urology

## 2023-01-27 ENCOUNTER — Ambulatory Visit (HOSPITAL_BASED_OUTPATIENT_CLINIC_OR_DEPARTMENT_OTHER)
Admission: RE | Admit: 2023-01-27 | Discharge: 2023-01-27 | Disposition: A | Discharge status: Home / Self Care | Payer: Medicare HMO | Attending: Urology | Admitting: Urology

## 2023-01-27 ENCOUNTER — Other Ambulatory Visit: Payer: Self-pay

## 2023-01-27 DIAGNOSIS — N401 Enlarged prostate with lower urinary tract symptoms: Secondary | ICD-10-CM | POA: Diagnosis not present

## 2023-01-27 DIAGNOSIS — R338 Other retention of urine: Secondary | ICD-10-CM | POA: Insufficient documentation

## 2023-01-27 DIAGNOSIS — Z01818 Encounter for other preprocedural examination: Secondary | ICD-10-CM

## 2023-01-27 DIAGNOSIS — Z8546 Personal history of malignant neoplasm of prostate: Secondary | ICD-10-CM | POA: Diagnosis not present

## 2023-01-27 DIAGNOSIS — N138 Other obstructive and reflux uropathy: Secondary | ICD-10-CM | POA: Insufficient documentation

## 2023-01-27 DIAGNOSIS — N400 Enlarged prostate without lower urinary tract symptoms: Secondary | ICD-10-CM

## 2023-01-27 HISTORY — DX: Benign prostatic hyperplasia without lower urinary tract symptoms: N40.0

## 2023-01-27 HISTORY — DX: Presence of spectacles and contact lenses: Z97.3

## 2023-01-27 HISTORY — DX: Other specified health status: Z78.9

## 2023-01-27 HISTORY — PX: CYSTOSCOPY WITH INSERTION OF UROLIFT: SHX6678

## 2023-01-27 HISTORY — DX: Hyperlipidemia, unspecified: E78.5

## 2023-01-27 HISTORY — DX: Feeling of incomplete bladder emptying: R39.14

## 2023-01-27 SURGERY — CYSTOSCOPY WITH INSERTION OF UROLIFT
Anesthesia: Monitor Anesthesia Care | Site: Prostate

## 2023-01-27 MED ORDER — ACETAMINOPHEN 500 MG PO TABS
1000.0000 mg | ORAL_TABLET | Freq: Once | ORAL | Status: AC
  Administered 2023-01-27: 1000 mg via ORAL

## 2023-01-27 MED ORDER — ACETAMINOPHEN 500 MG PO TABS
ORAL_TABLET | ORAL | Status: AC
  Filled 2023-01-27: qty 2

## 2023-01-27 MED ORDER — PROPOFOL 10 MG/ML IV BOLUS
INTRAVENOUS | Status: AC
  Filled 2023-01-27: qty 20

## 2023-01-27 MED ORDER — LACTATED RINGERS IV SOLN
INTRAVENOUS | Status: DC
  Administered 2023-01-27: 1000 mL via INTRAVENOUS

## 2023-01-27 MED ORDER — FENTANYL CITRATE (PF) 100 MCG/2ML IJ SOLN: 25.0000 ug | INTRAMUSCULAR | Status: DC | PRN

## 2023-01-27 MED ORDER — SODIUM CHLORIDE 0.9 % IV SOLN
INTRAVENOUS | Status: DC | PRN
  Administered 2023-01-27: 15 ug/kg/min via INTRAVENOUS

## 2023-01-27 MED ORDER — STERILE WATER FOR IRRIGATION IR SOLN
Status: DC | PRN
  Administered 2023-01-27: 3000 mL

## 2023-01-27 MED ORDER — SULFAMETHOXAZOLE-TRIMETHOPRIM 800-160 MG PO TABS: 1.0000 | ORAL_TABLET | Freq: Two times a day (BID) | ORAL | 0 refills | Status: AC

## 2023-01-27 MED ORDER — STERILE WATER FOR IRRIGATION IR SOLN
Status: DC | PRN
  Administered 2023-01-27: 500 mL

## 2023-01-27 MED ORDER — PROPOFOL 500 MG/50ML IV EMUL
INTRAVENOUS | Status: DC | PRN
  Administered 2023-01-27: 200 ug/kg/min via INTRAVENOUS

## 2023-01-27 MED ORDER — CEFAZOLIN SODIUM-DEXTROSE 2-4 GM/100ML-% IV SOLN
INTRAVENOUS | Status: AC
  Filled 2023-01-27: qty 100

## 2023-01-27 MED ORDER — SODIUM CHLORIDE 0.9 % IV SOLN: INTRAVENOUS | Status: DC | PRN

## 2023-01-27 MED ORDER — FENTANYL CITRATE (PF) 100 MCG/2ML IJ SOLN
INTRAMUSCULAR | Status: DC | PRN
  Administered 2023-01-27 (×2): 25 ug via INTRAVENOUS

## 2023-01-27 MED ORDER — FENTANYL CITRATE (PF) 100 MCG/2ML IJ SOLN
INTRAMUSCULAR | Status: AC
  Filled 2023-01-27: qty 2

## 2023-01-27 MED ORDER — OXYCODONE HCL 5 MG PO TABS: 5.0000 mg | ORAL_TABLET | Freq: Once | ORAL | Status: DC | PRN

## 2023-01-27 MED ORDER — AMISULPRIDE (ANTIEMETIC) 5 MG/2ML IV SOLN: 10.0000 mg | Freq: Once | INTRAVENOUS | Status: DC | PRN

## 2023-01-27 MED ORDER — OXYCODONE HCL 5 MG/5ML PO SOLN: 5.0000 mg | Freq: Once | ORAL | Status: DC | PRN

## 2023-01-27 MED ORDER — KETAMINE HCL 50 MG/5ML IJ SOSY
PREFILLED_SYRINGE | INTRAMUSCULAR | Status: AC
  Filled 2023-01-27: qty 5

## 2023-01-27 MED ORDER — LIDOCAINE HCL (PF) 2 % IJ SOLN
INTRAMUSCULAR | Status: AC
  Filled 2023-01-27: qty 5

## 2023-01-27 MED ORDER — PROPOFOL 10 MG/ML IV BOLUS
INTRAVENOUS | Status: DC | PRN
  Administered 2023-01-27: 30 mg via INTRAVENOUS

## 2023-01-27 MED ORDER — CEFAZOLIN SODIUM-DEXTROSE 2-4 GM/100ML-% IV SOLN
2.0000 g | INTRAVENOUS | Status: AC
  Administered 2023-01-27: 2 g via INTRAVENOUS

## 2023-01-27 MED ORDER — PROPOFOL 500 MG/50ML IV EMUL
INTRAVENOUS | Status: AC
  Filled 2023-01-27: qty 50

## 2023-01-27 SURGICAL SUPPLY — 16 items
BAG DRAIN URO-CYSTO SKYTR STRL (DRAIN) ×1 IMPLANT
BAG DRN RND TRDRP ANRFLXCHMBR (UROLOGICAL SUPPLIES) ×1
BAG DRN UROCATH (DRAIN) ×1
BAG URINE DRAIN 2000ML AR STRL (UROLOGICAL SUPPLIES) ×1 IMPLANT
BAG URINE LEG 500ML (DRAIN) ×2 IMPLANT
CATH FOLEY 2WAY SLVR  5CC 18FR (CATHETERS)
CATH FOLEY 2WAY SLVR 5CC 18FR (CATHETERS) IMPLANT
CLOTH BEACON ORANGE TIMEOUT ST (SAFETY) ×1 IMPLANT
GLOVE BIO SURGEON STRL SZ8 (GLOVE) ×1 IMPLANT
GOWN STRL REUS W/TWL XL LVL3 (GOWN DISPOSABLE) ×1 IMPLANT
KIT TURNOVER CYSTO (KITS) ×1 IMPLANT
MANIFOLD NEPTUNE II (INSTRUMENTS) ×1 IMPLANT
PACK CYSTO (CUSTOM PROCEDURE TRAY) ×1 IMPLANT
SYSTEM UROLIFT (Male Continence) IMPLANT
TUBE CONNECTING 12X1/4 (SUCTIONS) ×1 IMPLANT
WATER STERILE IRR 3000ML UROMA (IV SOLUTION) ×2 IMPLANT

## 2023-01-27 NOTE — Anesthesia Preprocedure Evaluation (Addendum)
Anesthesia Evaluation  Patient identified by MRN, date of birth, ID band Patient awake    Reviewed: Allergy & Precautions, NPO status , Patient's Chart, lab work & pertinent test results  Airway Mallampati: II  TM Distance: >3 FB Neck ROM: Full    Dental  (+) Dental Advisory Given   Pulmonary neg pulmonary ROS   breath sounds clear to auscultation       Cardiovascular negative cardio ROS  Rhythm:Regular Rate:Normal     Neuro/Psych negative neurological ROS     GI/Hepatic negative GI ROS, Neg liver ROS,,,  Endo/Other  negative endocrine ROS    Renal/GU negative Renal ROS     Musculoskeletal   Abdominal   Peds  Hematology negative hematology ROS (+)   Anesthesia Other Findings   Reproductive/Obstetrics                             Anesthesia Physical Anesthesia Plan  ASA: 2  Anesthesia Plan: MAC   Post-op Pain Management: Tylenol PO (pre-op)*   Induction: Intravenous  PONV Risk Score and Plan: 2 and Dexamethasone, Ondansetron, Treatment may vary due to age or medical condition and Propofol infusion  Airway Management Planned: Natural Airway and Simple Face Mask  Additional Equipment:   Intra-op Plan:   Post-operative Plan:   Informed Consent: I have reviewed the patients History and Physical, chart, labs and discussed the procedure including the risks, benefits and alternatives for the proposed anesthesia with the patient or authorized representative who has indicated his/her understanding and acceptance.     Dental advisory given  Plan Discussed with: CRNA  Anesthesia Plan Comments:        Anesthesia Quick Evaluation

## 2023-01-27 NOTE — Interval H&P Note (Signed)
History and Physical Interval Note:  01/27/2023 9:43 AM  Nicholas Santos  has presented today for surgery, with the diagnosis of BENIGN PROSTATIC HYPERPLASIA.  The various methods of treatment have been discussed with the patient and family. After consideration of risks, benefits and other options for treatment, the patient has consented to  Procedure(s) with comments: CYSTOSCOPY WITH INSERTION OF UROLIFT (N/A) - 30 MINS as a surgical intervention.  The patient's history has been reviewed, patient examined, no change in status, stable for surgery.  I have reviewed the patient's chart and labs.  Questions were answered to the patient's satisfaction.     Lillette Boxer Emillee Talsma

## 2023-01-27 NOTE — Discharge Instructions (Addendum)
You may see some blood in the urine and may have some burning with urination for 48-72 hours. You also may notice that you have to urinate more frequently or urgently after your procedure which is normal.  You should call should you develop an inability urinate, fever > 101, persistent nausea and vomiting that prevents you from eating or drinking to stay hydrated.  If you have a catheter, you will be taught how to take care of the catheter by the nursing staff prior to discharge from the hospital.  You may periodically feel a strong urge to void with the catheter in place.  This is a bladder spasm and most often can occur when having a bowel movement or moving around. It is typically self-limited and usually will stop after a few minutes.  You may use some Vaseline or Neosporin around the tip of the catheter to reduce friction at the tip of the penis. You may also see some blood in the urine.  A very small amount of blood can make the urine look quite red.  As long as the catheter is draining well, there usually is not a problem.  However, if the catheter is not draining well and is bloody, you should call the office [phone]) to notify us.  You can remove the catheter on Tuesday morning as instructed by the nurses. It is okay to stop the [place] in 1 week.        Post Anesthesia Home Care Instructions  Activity: Get plenty of rest for the remainder of the day. A responsible individual must stay with you for 24 hours following the procedure.  For the next 24 hours, DO NOT: -Drive a car -Paediatric nurse -Drink alcoholic beverages -Take any medication unless instructed by your physician -Make any legal decisions or sign important papers.  Meals: Start with liquid foods such as gelatin or soup. Progress to regular foods as tolerated. Avoid greasy, spicy, heavy foods. If nausea and/or vomiting occur, drink only clear liquids until the nausea and/or vomiting subsides. Call your physician if  vomiting continues.  Special Instructions/Symptoms: Your throat may feel dry or sore from the anesthesia or the breathing tube placed in your throat during surgery. If this causes discomfort, gargle with warm salt water. The discomfort should disappear within 24 hours.   No acetaminophen/Tylenol until after 2:45 pm today if needed.

## 2023-01-27 NOTE — Op Note (Signed)
Preoperative diagnosis: BPH with obstruction  Postoperative diagnosis: Same  Procedure: Cystoscopy, placement of 7 UroLift implants  Surgeon: Avonelle Viveros  Anesthesia: MAC  Complications: None  Estimated blood loss: Less than 10 mL  Indications: 77 year old male who has a history of prostate cancer and had I-125 brachytherapy in 2023.  He subsequently went into urinary retention.  He has been self treating with CIC recently.  Cystoscopy and urodynamics revealed obstruction of his prostate.  He presents at this time for management of his obstruction with UroLift.  We have talked about procedure, risks, complications as well as alternatives.  Because of his radiated prostate, I felt that the best treatment, with fewest long-term morbidities was a UroLift.  The patient accepts the procedure, and desires to proceed.  Findings: Urethra was normal, without lesions.  Prostatic urethra was approximately 3.5 cm long.  It was obstructive with bilobar hypertrophy.  His bladder was viewed systematically.  Ureteral orifices were normal.  There were moderate trabeculations.  No urothelial lesions were noted.  Description of procedure: The patient was properly identified in the holding area and taken to the operating room.  He was administered MAC and placed in the dorsolithotomy position.  Genitalia and perineum were prepped, draped, proper timeout performed.  UroLift cystoscope was passed under direct vision.  The above-mentioned inspection was noted with the above findings as listed.  The first 2 UroLift were on the right and left side of the proximal prostatic urethra, using compression of the prostate bilaterally, the first implants were placed approximately 1.5 cm from the bladder neck.  The second pair of UroLift implants were delivered at the verumontanum bilaterally.  The third pair were in the mid prostatic urethra.  All these implants were delivered in the anterior prostatic urethra approximately one  third from the anterior midline.  There was 1 small bulge of the right prostatic urethra, 1/7 implant was delivered in the mid urethra just between the mid and distal implant on the right.  At this point, there was a nice anterior channel from the verumontanum to the bladder neck with the bladder seen all the way through.  At this point the procedure was terminated.  The bladder was drained.  I passed an 4 French Foley catheter and hooked this to dependent drainage after the balloon was filled with 10 cc of water.  He tolerated the procedure well, was transported to the PACU in stable condition.

## 2023-01-27 NOTE — Transfer of Care (Signed)
Immediate Anesthesia Transfer of Care Note  Patient: Nicholas Santos  Procedure(s) Performed: Procedure(s) (LRB): CYSTOSCOPY WITH INSERTION OF UROLIFT (N/A)  Patient Location: PACU  Anesthesia Type: MAC  Level of Consciousness: awake, alert , oriented and patient cooperative  Airway & Oxygen Therapy: Patient Spontanous Breathing and Patient connected to face mask oxygen  Post-op Assessment: Report given to PACU RN and Post -op Vital signs reviewed and stable  Post vital signs: Reviewed and stable  Complications: No apparent anesthesia complications Last Vitals:  Vitals Value Taken Time  BP 143/77 01/27/23 1031  Temp    Pulse 46 01/27/23 1032  Resp 11 01/27/23 1032  SpO2 98 % 01/27/23 1032  Vitals shown include unvalidated device data.  Last Pain:  Vitals:   01/27/23 0831  TempSrc:   PainSc: 0-No pain      Patients Stated Pain Goal: 6 (75/30/05 1102)  Complications: No notable events documented.

## 2023-01-27 NOTE — Anesthesia Procedure Notes (Signed)
Procedure Name: MAC Date/Time: 01/27/2023 10:02 AM  Performed by: Suan Halter, CRNAPre-anesthesia Checklist: Patient identified, Emergency Drugs available, Suction available, Patient being monitored and Timeout performed Patient Re-evaluated:Patient Re-evaluated prior to induction Oxygen Delivery Method: Simple face mask

## 2023-01-28 NOTE — Progress Notes (Signed)
Spoke with pt at 1000, he had spoken to triage nurse about dripping blood and hadn't voided yet she told him to drink fluids and if unable to void call her back.  Pt been drinking and feel urge to urinate but is unable to.  Told pt to call triage nurse again and see if he can self cath himself or come to the office.

## 2023-01-28 NOTE — Anesthesia Postprocedure Evaluation (Signed)
Anesthesia Post Note  Patient: Nicholas Santos  Procedure(s) Performed: CYSTOSCOPY WITH INSERTION OF UROLIFT (Prostate)     Patient location during evaluation: PACU Anesthesia Type: MAC Level of consciousness: awake and alert Pain management: pain level controlled Vital Signs Assessment: post-procedure vital signs reviewed and stable Respiratory status: spontaneous breathing, nonlabored ventilation, respiratory function stable and patient connected to nasal cannula oxygen Cardiovascular status: blood pressure returned to baseline and stable Postop Assessment: no apparent nausea or vomiting Anesthetic complications: no   No notable events documented.  Last Vitals:  Vitals:   01/27/23 1100 01/27/23 1140  BP: (!) 176/79 (!) 168/68  Pulse: (!) 48 (!) 46  Resp: 14 16  Temp:  (!) 36.3 C  SpO2: 97% 99%    Last Pain:  Vitals:   01/27/23 1140  TempSrc:   PainSc: 2                  Tiajuana Amass

## 2023-01-29 ENCOUNTER — Encounter (HOSPITAL_BASED_OUTPATIENT_CLINIC_OR_DEPARTMENT_OTHER): Payer: Self-pay | Admitting: Urology

## 2023-01-29 DIAGNOSIS — R339 Retention of urine, unspecified: Secondary | ICD-10-CM | POA: Diagnosis not present

## 2023-03-05 DIAGNOSIS — R339 Retention of urine, unspecified: Secondary | ICD-10-CM | POA: Diagnosis not present

## 2023-04-11 DIAGNOSIS — C61 Malignant neoplasm of prostate: Secondary | ICD-10-CM | POA: Diagnosis not present

## 2023-04-11 DIAGNOSIS — N401 Enlarged prostate with lower urinary tract symptoms: Secondary | ICD-10-CM | POA: Diagnosis not present

## 2023-04-11 DIAGNOSIS — R338 Other retention of urine: Secondary | ICD-10-CM | POA: Diagnosis not present

## 2023-04-22 DIAGNOSIS — R600 Localized edema: Secondary | ICD-10-CM | POA: Diagnosis not present

## 2023-04-22 DIAGNOSIS — Z6824 Body mass index (BMI) 24.0-24.9, adult: Secondary | ICD-10-CM | POA: Diagnosis not present

## 2023-04-23 ENCOUNTER — Other Ambulatory Visit (HOSPITAL_COMMUNITY): Payer: Self-pay | Admitting: Family Medicine

## 2023-04-23 ENCOUNTER — Ambulatory Visit (HOSPITAL_COMMUNITY)
Admission: RE | Admit: 2023-04-23 | Discharge: 2023-04-23 | Disposition: A | Discharge status: Home / Self Care | Payer: Medicare HMO | Source: Ambulatory Visit | Attending: Cardiovascular Disease | Admitting: Cardiovascular Disease

## 2023-04-23 DIAGNOSIS — M7989 Other specified soft tissue disorders: Secondary | ICD-10-CM

## 2023-04-23 DIAGNOSIS — M79661 Pain in right lower leg: Secondary | ICD-10-CM | POA: Diagnosis not present

## 2023-04-25 DIAGNOSIS — R339 Retention of urine, unspecified: Secondary | ICD-10-CM | POA: Diagnosis not present

## 2023-05-05 DIAGNOSIS — R351 Nocturia: Secondary | ICD-10-CM | POA: Diagnosis not present

## 2023-05-05 DIAGNOSIS — R3914 Feeling of incomplete bladder emptying: Secondary | ICD-10-CM | POA: Diagnosis not present

## 2023-05-05 DIAGNOSIS — C61 Malignant neoplasm of prostate: Secondary | ICD-10-CM | POA: Diagnosis not present

## 2023-05-05 DIAGNOSIS — N401 Enlarged prostate with lower urinary tract symptoms: Secondary | ICD-10-CM | POA: Diagnosis not present

## 2023-07-04 DIAGNOSIS — D225 Melanocytic nevi of trunk: Secondary | ICD-10-CM | POA: Diagnosis not present

## 2023-07-04 DIAGNOSIS — Z86006 Personal history of melanoma in-situ: Secondary | ICD-10-CM | POA: Diagnosis not present

## 2023-07-04 DIAGNOSIS — L601 Onycholysis: Secondary | ICD-10-CM | POA: Diagnosis not present

## 2023-07-04 DIAGNOSIS — L121 Cicatricial pemphigoid: Secondary | ICD-10-CM | POA: Diagnosis not present

## 2023-07-04 DIAGNOSIS — Z85828 Personal history of other malignant neoplasm of skin: Secondary | ICD-10-CM | POA: Diagnosis not present

## 2023-07-04 DIAGNOSIS — L821 Other seborrheic keratosis: Secondary | ICD-10-CM | POA: Diagnosis not present

## 2023-07-04 DIAGNOSIS — L578 Other skin changes due to chronic exposure to nonionizing radiation: Secondary | ICD-10-CM | POA: Diagnosis not present

## 2023-07-04 DIAGNOSIS — L570 Actinic keratosis: Secondary | ICD-10-CM | POA: Diagnosis not present

## 2023-07-18 DIAGNOSIS — H2513 Age-related nuclear cataract, bilateral: Secondary | ICD-10-CM | POA: Diagnosis not present

## 2023-08-06 DIAGNOSIS — U071 COVID-19: Secondary | ICD-10-CM | POA: Diagnosis not present

## 2023-08-11 DIAGNOSIS — N401 Enlarged prostate with lower urinary tract symptoms: Secondary | ICD-10-CM | POA: Diagnosis not present

## 2023-08-11 DIAGNOSIS — C61 Malignant neoplasm of prostate: Secondary | ICD-10-CM | POA: Diagnosis not present

## 2023-08-11 DIAGNOSIS — R3914 Feeling of incomplete bladder emptying: Secondary | ICD-10-CM | POA: Diagnosis not present

## 2023-09-17 DIAGNOSIS — N401 Enlarged prostate with lower urinary tract symptoms: Secondary | ICD-10-CM | POA: Diagnosis not present

## 2023-09-17 DIAGNOSIS — R339 Retention of urine, unspecified: Secondary | ICD-10-CM | POA: Diagnosis not present

## 2023-12-04 DIAGNOSIS — Z6825 Body mass index (BMI) 25.0-25.9, adult: Secondary | ICD-10-CM | POA: Diagnosis not present

## 2023-12-04 DIAGNOSIS — Z0000 Encounter for general adult medical examination without abnormal findings: Secondary | ICD-10-CM | POA: Diagnosis not present

## 2023-12-04 DIAGNOSIS — Z1331 Encounter for screening for depression: Secondary | ICD-10-CM | POA: Diagnosis not present

## 2023-12-04 DIAGNOSIS — Z23 Encounter for immunization: Secondary | ICD-10-CM | POA: Diagnosis not present

## 2024-01-23 DIAGNOSIS — Z85828 Personal history of other malignant neoplasm of skin: Secondary | ICD-10-CM | POA: Diagnosis not present

## 2024-01-23 DIAGNOSIS — L570 Actinic keratosis: Secondary | ICD-10-CM | POA: Diagnosis not present

## 2024-01-23 DIAGNOSIS — D485 Neoplasm of uncertain behavior of skin: Secondary | ICD-10-CM | POA: Diagnosis not present

## 2024-01-23 DIAGNOSIS — D0439 Carcinoma in situ of skin of other parts of face: Secondary | ICD-10-CM | POA: Diagnosis not present

## 2024-01-23 DIAGNOSIS — Z86006 Personal history of melanoma in-situ: Secondary | ICD-10-CM | POA: Diagnosis not present

## 2024-01-23 DIAGNOSIS — D225 Melanocytic nevi of trunk: Secondary | ICD-10-CM | POA: Diagnosis not present

## 2024-01-23 DIAGNOSIS — L821 Other seborrheic keratosis: Secondary | ICD-10-CM | POA: Diagnosis not present

## 2024-01-23 DIAGNOSIS — L578 Other skin changes due to chronic exposure to nonionizing radiation: Secondary | ICD-10-CM | POA: Diagnosis not present

## 2024-02-02 DIAGNOSIS — I10 Essential (primary) hypertension: Secondary | ICD-10-CM | POA: Diagnosis not present

## 2024-02-02 DIAGNOSIS — R7303 Prediabetes: Secondary | ICD-10-CM | POA: Diagnosis not present

## 2024-02-13 DIAGNOSIS — R3914 Feeling of incomplete bladder emptying: Secondary | ICD-10-CM | POA: Diagnosis not present

## 2024-02-13 DIAGNOSIS — C61 Malignant neoplasm of prostate: Secondary | ICD-10-CM | POA: Diagnosis not present

## 2024-02-13 DIAGNOSIS — N401 Enlarged prostate with lower urinary tract symptoms: Secondary | ICD-10-CM | POA: Diagnosis not present

## 2024-02-26 DIAGNOSIS — C44329 Squamous cell carcinoma of skin of other parts of face: Secondary | ICD-10-CM | POA: Diagnosis not present

## 2024-06-01 DIAGNOSIS — Z6823 Body mass index (BMI) 23.0-23.9, adult: Secondary | ICD-10-CM | POA: Diagnosis not present

## 2024-07-23 DIAGNOSIS — Z86006 Personal history of melanoma in-situ: Secondary | ICD-10-CM | POA: Diagnosis not present

## 2024-07-23 DIAGNOSIS — L570 Actinic keratosis: Secondary | ICD-10-CM | POA: Diagnosis not present

## 2024-07-23 DIAGNOSIS — Z85828 Personal history of other malignant neoplasm of skin: Secondary | ICD-10-CM | POA: Diagnosis not present

## 2024-07-23 DIAGNOSIS — D225 Melanocytic nevi of trunk: Secondary | ICD-10-CM | POA: Diagnosis not present

## 2024-07-23 DIAGNOSIS — D485 Neoplasm of uncertain behavior of skin: Secondary | ICD-10-CM | POA: Diagnosis not present

## 2024-07-23 DIAGNOSIS — L821 Other seborrheic keratosis: Secondary | ICD-10-CM | POA: Diagnosis not present

## 2024-07-23 DIAGNOSIS — L578 Other skin changes due to chronic exposure to nonionizing radiation: Secondary | ICD-10-CM | POA: Diagnosis not present

## 2024-08-09 DIAGNOSIS — C61 Malignant neoplasm of prostate: Secondary | ICD-10-CM | POA: Diagnosis not present

## 2024-08-16 DIAGNOSIS — H35729 Serous detachment of retinal pigment epithelium, unspecified eye: Secondary | ICD-10-CM | POA: Diagnosis not present

## 2024-08-16 DIAGNOSIS — H2513 Age-related nuclear cataract, bilateral: Secondary | ICD-10-CM | POA: Diagnosis not present

## 2024-09-16 DIAGNOSIS — R339 Retention of urine, unspecified: Secondary | ICD-10-CM | POA: Diagnosis not present

## 2024-09-17 DIAGNOSIS — Z23 Encounter for immunization: Secondary | ICD-10-CM | POA: Diagnosis not present

## 2024-12-08 DIAGNOSIS — Z1331 Encounter for screening for depression: Secondary | ICD-10-CM | POA: Diagnosis not present

## 2024-12-08 DIAGNOSIS — Z0000 Encounter for general adult medical examination without abnormal findings: Secondary | ICD-10-CM | POA: Diagnosis not present

## 2024-12-09 DIAGNOSIS — Z6824 Body mass index (BMI) 24.0-24.9, adult: Secondary | ICD-10-CM | POA: Diagnosis not present

## 2024-12-09 DIAGNOSIS — R7303 Prediabetes: Secondary | ICD-10-CM | POA: Diagnosis not present

## 2024-12-09 DIAGNOSIS — I10 Essential (primary) hypertension: Secondary | ICD-10-CM | POA: Diagnosis not present

## 2024-12-09 DIAGNOSIS — N529 Male erectile dysfunction, unspecified: Secondary | ICD-10-CM | POA: Diagnosis not present

## 2024-12-09 DIAGNOSIS — E7800 Pure hypercholesterolemia, unspecified: Secondary | ICD-10-CM | POA: Diagnosis not present

## 2024-12-09 DIAGNOSIS — Z8546 Personal history of malignant neoplasm of prostate: Secondary | ICD-10-CM | POA: Diagnosis not present

## 2024-12-09 DIAGNOSIS — R339 Retention of urine, unspecified: Secondary | ICD-10-CM | POA: Diagnosis not present
